# Patient Record
Sex: Female | Born: 1975 | Race: White | Hispanic: No | Marital: Married | Smoking: Never smoker
Health system: Southern US, Community
[De-identification: ages and names within clinical notes are randomized; demographics above are authoritative.]

## PROBLEM LIST (undated history)

## (undated) DIAGNOSIS — F329 Major depressive disorder, single episode, unspecified: Secondary | ICD-10-CM

## (undated) DIAGNOSIS — G4730 Sleep apnea, unspecified: Secondary | ICD-10-CM

## (undated) DIAGNOSIS — F419 Anxiety disorder, unspecified: Secondary | ICD-10-CM

## (undated) DIAGNOSIS — F32A Depression, unspecified: Secondary | ICD-10-CM

## (undated) DIAGNOSIS — R51 Headache: Secondary | ICD-10-CM

## (undated) DIAGNOSIS — D229 Melanocytic nevi, unspecified: Secondary | ICD-10-CM

## (undated) HISTORY — DX: Melanocytic nevi, unspecified: D22.9

## (undated) HISTORY — PX: NOVASURE ABLATION: SHX5394

## (undated) HISTORY — PX: CHOLECYSTECTOMY: SHX55

## (undated) HISTORY — PX: TUBAL LIGATION: SHX77

---

## 2007-05-09 HISTORY — PX: LAPAROSCOPIC GASTRIC BANDING: SHX1100

## 2007-07-05 ENCOUNTER — Ambulatory Visit (HOSPITAL_COMMUNITY): Admission: RE | Admit: 2007-07-05 | Discharge: 2007-07-05 | Payer: Self-pay | Admitting: Obstetrics

## 2007-07-05 ENCOUNTER — Encounter (INDEPENDENT_AMBULATORY_CARE_PROVIDER_SITE_OTHER): Payer: Self-pay | Admitting: Obstetrics

## 2007-07-06 ENCOUNTER — Inpatient Hospital Stay (HOSPITAL_COMMUNITY): Admission: AD | Admit: 2007-07-06 | Discharge: 2007-07-06 | Payer: Self-pay | Admitting: Obstetrics

## 2010-09-20 NOTE — Op Note (Signed)
NAMEJOYDAN, GRETZINGER              ACCOUNT NO.:  [id number]   MEDICAL RECORD NO.:  [id number]          PATIENT TYPE:  AMB   LOCATION:  SDC                           FACILITY:  [facility]   PHYSICIAN:  Lendon Colonel, MD   DATE OF BIRTH:  [birth year]   DATE OF PROCEDURE:  07/05/2007  DATE OF DISCHARGE:                               OPERATIVE REPORT   PREOPERATIVE DIAGNOSIS:  Menorrhagia.   POSTOPERATIVE DIAGNOSIS:  Menorrhagia.   OPERATION/PROCEDURE:  1. Dilatation and curettage.  2. Hysteroscopy.  3. NovaSure endometrial ablation.   SURGEON:  Lendon Colonel, M.D.   ASSISTANT:  None.   ANESTHESIA:  General.   FINDINGS:  Normal bilateral ostia were visualized.  No fibroids or  polyps.  Fluffy endometrium.   SPECIMENS:  EC specimen to pathology.   ESTIMATED BLOOD LOSS:  Minimal.   COMPLICATIONS:  None.   URINARY OUTPUT:  500 mL straight catheter.   INDICATIONS:  This is a 35 year old female with long history of  menorrhagia, normal preoperative ultrasound and preoperative endometrial  biopsy who after discussion of each the methods to control her  menorrhagia, elected for NovaSure endometrial ablation with preprocedure  hysteroscopy to insure no polyps or fibroids.   DESCRIPTION OF PROCEDURE:  After informed consent was obtained, the  patient was taken to the operating room where general anesthesia was  initiated without difficulty.  She was prepped and draped in the normal  sterile fashion.  She was in supine lithotomy position and straight cath  was performed for 500 mL of clear urine.  Bimanual examination was  performed assessing  the size and position of the uterus.  A sterile  speculum was inserted.  Single-tooth tenaculum was used to grasp the  anterior lip of the cervix.  The cervix was sounded to 7.5 cm and with  Shawnie Pons dilators and a sound and Hegar dilators were used to obtain the  cervical length of 3.5 cm.  In addition, the NovaSure sound device was  used to  reaffirm cavity length of 4 cm.  The Betocci  hysteroscope was  inserted into the uterine cavity.  No polyps or fibroids were noted.  Gross fluffy endometrium was noted and bilateral ostia were visualized.  Sharp curettage was done and specimens sent to pathology.  The NovaSure  device was then inserted into the uterus with a length setting of 4 and  a width setting of 3.5 cm and endometrial ablation device was employed  for a total of 1 minute and 57 seconds.  The device was then removed.  The tenaculum was removed and 10 mL of 1% lidocaine  was infused into the cervicovaginal junction.  The patient was awakened  from general anesthesia having tolerated the procedure well.  Sponge,  lap and needle counts were correct x3.  The patient was taken to the  recovery room in stable condition.      Lendon Colonel, MD  Electronically Signed     KAF/MEDQ  D:  07/05/2007  T:  07/06/2007  Job:  [id number]

## 2011-01-30 LAB — CBC
MCHC: 35.2
MCV: 88.8
Platelets: 358

## 2011-01-30 LAB — URINALYSIS, ROUTINE W REFLEX MICROSCOPIC
Bilirubin Urine: NEGATIVE
Nitrite: NEGATIVE
Specific Gravity, Urine: 1.02
pH: 5.5

## 2011-01-30 LAB — ABO/RH: ABO/RH(D): A POS

## 2011-01-30 LAB — PREGNANCY, URINE: Preg Test, Ur: NEGATIVE

## 2011-04-27 DIAGNOSIS — D229 Melanocytic nevi, unspecified: Secondary | ICD-10-CM

## 2011-04-27 HISTORY — DX: Melanocytic nevi, unspecified: D22.9

## 2011-05-25 ENCOUNTER — Other Ambulatory Visit: Payer: Self-pay | Admitting: Physician Assistant

## 2011-06-29 ENCOUNTER — Other Ambulatory Visit: Payer: Self-pay | Admitting: Physician Assistant

## 2011-11-29 ENCOUNTER — Other Ambulatory Visit: Payer: Self-pay | Admitting: Physician Assistant

## 2012-01-03 ENCOUNTER — Encounter (HOSPITAL_COMMUNITY): Payer: Self-pay | Admitting: Emergency Medicine

## 2012-01-03 ENCOUNTER — Emergency Department (HOSPITAL_COMMUNITY)
Admission: EM | Admit: 2012-01-03 | Discharge: 2012-01-03 | Disposition: A | Discharge status: Home / Self Care | Payer: BC Managed Care – PPO | Attending: Emergency Medicine | Admitting: Emergency Medicine

## 2012-01-03 ENCOUNTER — Emergency Department (HOSPITAL_COMMUNITY): Payer: BC Managed Care – PPO

## 2012-01-03 DIAGNOSIS — R569 Unspecified convulsions: Secondary | ICD-10-CM | POA: Insufficient documentation

## 2012-01-03 DIAGNOSIS — Z79899 Other long term (current) drug therapy: Secondary | ICD-10-CM | POA: Insufficient documentation

## 2012-01-03 DIAGNOSIS — R4182 Altered mental status, unspecified: Secondary | ICD-10-CM | POA: Insufficient documentation

## 2012-01-03 DIAGNOSIS — R55 Syncope and collapse: Secondary | ICD-10-CM | POA: Insufficient documentation

## 2012-01-03 LAB — CBC WITH DIFFERENTIAL/PLATELET
Eosinophils Absolute: 0.3 10*3/uL (ref 0.0–0.7)
Eosinophils Relative: 3 % (ref 0–5)
HCT: 35.8 % — ABNORMAL LOW (ref 36.0–46.0)
Hemoglobin: 12.6 g/dL (ref 12.0–15.0)
Lymphs Abs: 2.1 10*3/uL (ref 0.7–4.0)
MCH: 31.6 pg (ref 26.0–34.0)
MCV: 89.7 fL (ref 78.0–100.0)
Monocytes Absolute: 0.6 10*3/uL (ref 0.1–1.0)
Monocytes Relative: 6 % (ref 3–12)
RBC: 3.99 MIL/uL (ref 3.87–5.11)

## 2012-01-03 LAB — BASIC METABOLIC PANEL
BUN: 12 mg/dL (ref 6–23)
Calcium: 8.7 mg/dL (ref 8.4–10.5)
GFR calc non Af Amer: 85 mL/min — ABNORMAL LOW (ref >90)
Glucose, Bld: 80 mg/dL (ref 70–99)
Potassium: 4.6 mEq/L (ref 3.5–5.1)

## 2012-01-03 MED ORDER — MORPHINE SULFATE 4 MG/ML IJ SOLN
4.0000 mg | Freq: Once | INTRAMUSCULAR | Status: AC
  Administered 2012-01-03: 4 mg via INTRAVENOUS
  Filled 2012-01-03: qty 1

## 2012-01-03 MED ORDER — ONDANSETRON HCL 4 MG/2ML IJ SOLN
4.0000 mg | Freq: Once | INTRAMUSCULAR | Status: AC
  Administered 2012-01-03: 4 mg via INTRAVENOUS
  Filled 2012-01-03: qty 2

## 2012-01-03 MED ORDER — HYDROCODONE-ACETAMINOPHEN 5-500 MG PO TABS: 1.0000 | ORAL_TABLET | Freq: Four times a day (QID) | ORAL | Status: AC | PRN

## 2012-01-03 MED ORDER — SODIUM CHLORIDE 0.9 % IV BOLUS (SEPSIS)
1000.0000 mL | Freq: Once | INTRAVENOUS | Status: AC
  Administered 2012-01-03: 1000 mL via INTRAVENOUS

## 2012-01-03 MED ORDER — ONDANSETRON HCL 4 MG PO TABS: 4.0000 mg | ORAL_TABLET | Freq: Four times a day (QID) | ORAL | Status: AC

## 2012-01-03 NOTE — ED Provider Notes (Signed)
History     CSN: [id number]  Arrival date & time 01/03/12  1203   First MD Initiated Contact with Patient 01/03/12 1214      Chief Complaint  Patient presents with  . Seizures    (Consider location/radiation/quality/duration/timing/severity/associated sxs/prior treatment) HPI  To the emergency department from her gynecologist office of a syncopal episode. The patient was having a endometrial biopsy procedure done when she said she felt extreme pain. The gynecologist then gave her a shot of lidocaine into her endometrium and the patient says she immediately felt a warm sensation going up her head and she fainted. The mom who is also the room states that right after her left arm began to convulse. This episode lasted approximately 2 minutes. That was convulsing was the left arm. There is no associated diaphoresis, vomiting, loss of incontinence, or post ictal state. Upon arrival to the ER the patient began to vomit and had 3 vomiting episodes. Alert and oriented she is in no acute distress.  History reviewed. No pertinent past medical history.  Past Surgical History  Procedure Date  . Cholecystectomy   . Tubal ligation     No family history on file.  History  Substance Use Topics  . Smoking status: Never Smoker   . Smokeless tobacco: Not on file  . Alcohol Use: No    OB History    Grav Para Term Preterm Abortions TAB SAB Ect Mult Living                  Review of Systems  All other systems reviewed and are negative.    Allergies  Penicillins  Home Medications   Current Outpatient Rx  Name Route Sig Dispense Refill  . CLONAZEPAM 1 MG PO TABS Oral Take 1 mg by mouth 2 (two) times daily as needed.    . CYCLOBENZAPRINE HCL 10 MG PO TABS Oral Take 10 mg by mouth 3 (three) times daily as needed. For muscle spasms.    Marland Kitchen GABAPENTIN 300 MG PO CAPS Oral Take 300 mg by mouth 3 (three) times daily.    Marland Kitchen OXCARBAZEPINE 300 MG PO TABS Oral Take 600 mg by mouth 2 (two) times  daily.    . TRAZODONE HCL 100 MG PO TABS Oral Take 150 mg by mouth at bedtime.    Marland Kitchen HYDROCODONE-ACETAMINOPHEN 5-500 MG PO TABS Oral Take 1 tablet by mouth every 6 (six) hours as needed for pain. 15 tablet 0  . ONDANSETRON HCL 4 MG PO TABS Oral Take 1 tablet (4 mg total) by mouth every 6 (six) hours. 12 tablet 0    BP 118/83  Pulse 64  Temp 98.5 F (36.9 C) (Oral)  Resp 16  SpO2 99%  Physical Exam  Nursing note and vitals reviewed. Constitutional: She appears well-developed and well-nourished. No distress.  HENT:  Head: Normocephalic and atraumatic.  Eyes: Pupils are equal, round, and reactive to light.  Neck: Normal range of motion. Neck supple.  Cardiovascular: Normal rate and regular rhythm.   Pulmonary/Chest: Effort normal.  Abdominal: Soft.  Neurological: She is alert. She has normal strength. No cranial nerve deficit (3-12 intact) or sensory deficit. GCS eye subscore is 4. GCS verbal subscore is 5. GCS motor subscore is 6.  Skin: Skin is warm and dry.    ED Course  Procedures (including critical care time)  Labs Reviewed  CBC WITH DIFFERENTIAL - Abnormal; Notable for the following:    HCT 35.8 (*)     All other  components within normal limits  BASIC METABOLIC PANEL - Abnormal; Notable for the following:    GFR calc non Af Amer 85 (*)     All other components within normal limits   Ct Head Wo Contrast  01/03/2012  *RADIOLOGY REPORT*  Clinical Data: Altered mental status, seizure  CT HEAD WITHOUT CONTRAST  Technique:  Contiguous axial images were obtained from the base of the skull through the vertex without contrast.  Comparison: None.  Findings: No acute intracranial hemorrhage.  No focal mass lesion. No CT evidence of acute infarction.   No midline shift or mass effect.  No hydrocephalus.  Basilar cisterns are patent. Paranasal sinuses and mastoid air cells are clear.  Orbits are normal.  IMPRESSION: Normal CT  head   Original Report Authenticated By: Genevive Bi,  M.D.      1. Convulsive syncope       MDM   Patients labs are normal. This does not sound consistent with a seizure. I consulted neurology to advise at the patient white count remained stable, the potassium did not drop. He believes this is syncope with convulsions due to the access adrenaline in her system, I agree with his opinion.   I have discussed and reassured patient and family. i do not believe that she needs to follow-up with neuro or be started on any seizure medications.  Pt has been advised of the symptoms that warrant their return to the ED. Patient has voiced understanding and has agreed to follow-up with the PCP or specialist.         Dorthula Matas, PA 01/03/12 1549

## 2012-01-03 NOTE — ED Notes (Signed)
Patient transported to CT 

## 2012-01-03 NOTE — ED Notes (Signed)
Pt undressed, in gown, on continuous pulse oximetry and blood pressure cuff 

## 2012-01-03 NOTE — ED Notes (Signed)
To ED from [facility] GYN via EMS, pt received lidocaine/epi shot, passed out and had a witnessed 3 min seizure by MD, was post-ictal following, A/O on EMS arrival, c/o HA, weakness, no incontence or oral trauma, CBG 276, had 700 ml D5 prior to EMS arrival

## 2012-01-03 NOTE — ED Notes (Signed)
Family at bedside. 

## 2012-01-04 NOTE — ED Provider Notes (Signed)
Medical screening examination/treatment/procedure(s) were performed by non-physician practitioner and as supervising physician I was immediately available for consultation/collaboration.   Loren Racer, MD 01/04/12 [phone]

## 2012-01-24 ENCOUNTER — Encounter (HOSPITAL_COMMUNITY): Payer: Self-pay | Admitting: Pharmacist

## 2012-01-29 ENCOUNTER — Encounter (HOSPITAL_COMMUNITY): Payer: Self-pay

## 2012-01-29 ENCOUNTER — Other Ambulatory Visit: Payer: Self-pay | Admitting: Obstetrics

## 2012-01-29 ENCOUNTER — Encounter (HOSPITAL_COMMUNITY)
Admission: RE | Admit: 2012-01-29 | Discharge: 2012-01-29 | Disposition: A | Discharge status: Home / Self Care | Payer: BC Managed Care – PPO | Source: Ambulatory Visit | Attending: Obstetrics | Admitting: Obstetrics

## 2012-01-29 HISTORY — DX: Anxiety disorder, unspecified: F41.9

## 2012-01-29 HISTORY — DX: Depression, unspecified: F32.A

## 2012-01-29 HISTORY — DX: Headache: R51

## 2012-01-29 HISTORY — DX: Sleep apnea, unspecified: G47.30

## 2012-01-29 HISTORY — DX: Major depressive disorder, single episode, unspecified: F32.9

## 2012-01-29 LAB — CBC
HCT: 40.5 % (ref 36.0–46.0)
MCV: 88.8 fL (ref 78.0–100.0)
RDW: 12.3 % (ref 11.5–15.5)
WBC: 11.9 10*3/uL — ABNORMAL HIGH (ref 4.0–10.5)

## 2012-01-29 NOTE — Patient Instructions (Signed)
Your procedure is scheduled on:02/07/12  Enter through the Main Entrance at :6am Pick up desk phone and dial [phone] and inform us of your arrival.  Please call [phone] if you have any problems the morning of surgery.  Remember: Do not eat after midnight:Tuesday Do not drink after:midnight Tuesday  Take these meds the morning of surgery with a sip of water:Inderal   DO NOT wear jewelry, eye make-up, lipstick,body lotion, or dark fingernail polish. Do not shave for 48 hours prior to surgery.  If you are to be admitted after surgery, leave suitcase in car until your room has been assigned. Patients discharged on the day of surgery will not be allowed to drive home.   Remember to use your Hibiclens as instructed.

## 2012-02-01 ENCOUNTER — Other Ambulatory Visit: Payer: Self-pay | Admitting: Obstetrics

## 2012-02-06 ENCOUNTER — Encounter (HOSPITAL_COMMUNITY): Payer: Self-pay | Admitting: Anesthesiology

## 2012-02-06 MED ORDER — GENTAMICIN SULFATE 40 MG/ML IJ SOLN
INTRAVENOUS | Status: AC
  Administered 2012-02-07: 100 mL via INTRAVENOUS
  Filled 2012-02-06: qty 8.88

## 2012-02-06 NOTE — Anesthesia Preprocedure Evaluation (Addendum)
Anesthesia Evaluation  Patient identified by MRN, date of birth, ID band Patient awake    Reviewed: Allergy & Precautions, H&P , NPO status , Patient's Chart, lab work & pertinent test results  Airway Mallampati: III TM Distance: >3 FB Neck ROM: Full    Dental No notable dental hx. (+) Teeth Intact   Pulmonary sleep apnea ,  breath sounds clear to auscultation  Pulmonary exam normal       Cardiovascular negative cardio ROS  Rhythm:Regular Rate:Normal     Neuro/Psych  Headaches, PSYCHIATRIC DISORDERS Anxiety Depression    GI/Hepatic negative GI ROS, Neg liver ROS,   Endo/Other  negative endocrine ROS  Renal/GU negative Renal ROS  negative genitourinary   Musculoskeletal negative musculoskeletal ROS (+)   Abdominal   Peds  Hematology negative hematology ROS (+)   Anesthesia Other Findings   Reproductive/Obstetrics DUB Pelvic Pain Failed Novasure Ablation                         Anesthesia Physical Anesthesia Plan  ASA: III  Anesthesia Plan: General   Post-op Pain Management:    Induction: Intravenous  Airway Management Planned: Oral ETT  Additional Equipment:   Intra-op Plan:   Post-operative Plan: Extubation in OR  Informed Consent: I have reviewed the patients History and Physical, chart, labs and discussed the procedure including the risks, benefits and alternatives for the proposed anesthesia with the patient or authorized representative who has indicated his/her understanding and acceptance.   Dental advisory given  Plan Discussed with: CRNA, Anesthesiologist and Surgeon  Anesthesia Plan Comments:         Anesthesia Quick Evaluation

## 2012-02-07 ENCOUNTER — Encounter (HOSPITAL_COMMUNITY): Payer: Self-pay | Admitting: Anesthesiology

## 2012-02-07 ENCOUNTER — Ambulatory Visit (HOSPITAL_COMMUNITY)
Admission: RE | Admit: 2012-02-07 | Discharge: 2012-02-08 | Disposition: A | Discharge status: Home / Self Care | Payer: BC Managed Care – PPO | Source: Ambulatory Visit | Attending: Obstetrics | Admitting: Obstetrics

## 2012-02-07 ENCOUNTER — Encounter (HOSPITAL_COMMUNITY): Payer: Self-pay | Admitting: *Deleted

## 2012-02-07 ENCOUNTER — Ambulatory Visit (HOSPITAL_COMMUNITY): Payer: BC Managed Care – PPO | Admitting: Anesthesiology

## 2012-02-07 ENCOUNTER — Encounter (HOSPITAL_COMMUNITY)
Admission: RE | Disposition: A | Discharge status: Home / Self Care | Payer: Self-pay | Source: Ambulatory Visit | Attending: Obstetrics

## 2012-02-07 DIAGNOSIS — Z01818 Encounter for other preprocedural examination: Secondary | ICD-10-CM | POA: Insufficient documentation

## 2012-02-07 DIAGNOSIS — N938 Other specified abnormal uterine and vaginal bleeding: Principal | ICD-10-CM | POA: Insufficient documentation

## 2012-02-07 DIAGNOSIS — N949 Unspecified condition associated with female genital organs and menstrual cycle: Secondary | ICD-10-CM | POA: Insufficient documentation

## 2012-02-07 DIAGNOSIS — Z01812 Encounter for preprocedural laboratory examination: Secondary | ICD-10-CM | POA: Insufficient documentation

## 2012-02-07 HISTORY — PX: LAPAROSCOPIC HYSTERECTOMY: SHX1926

## 2012-02-07 LAB — BASIC METABOLIC PANEL
BUN: 10 mg/dL (ref 6–23)
Calcium: 9.2 mg/dL (ref 8.4–10.5)
Creatinine, Ser: 0.8 mg/dL (ref 0.50–1.10)
GFR calc non Af Amer: >90 mL/min (ref >90)
Glucose, Bld: 95 mg/dL (ref 70–99)
Sodium: 135 mEq/L (ref 135–145)

## 2012-02-07 SURGERY — HYSTERECTOMY, TOTAL, LAPAROSCOPIC
Anesthesia: General | Wound class: Clean Contaminated

## 2012-02-07 MED ORDER — PHENYLEPHRINE 40 MCG/ML (10ML) SYRINGE FOR IV PUSH (FOR BLOOD PRESSURE SUPPORT)
PREFILLED_SYRINGE | INTRAVENOUS | Status: AC
  Filled 2012-02-07: qty 10

## 2012-02-07 MED ORDER — MIDAZOLAM HCL 2 MG/2ML IJ SOLN
INTRAMUSCULAR | Status: AC
  Filled 2012-02-07: qty 2

## 2012-02-07 MED ORDER — MENTHOL 3 MG MT LOZG: 1.0000 | LOZENGE | OROMUCOSAL | Status: DC | PRN

## 2012-02-07 MED ORDER — HYDROMORPHONE HCL PF 1 MG/ML IJ SOLN
INTRAMUSCULAR | Status: DC | PRN
  Administered 2012-02-07: 1 mg via INTRAVENOUS

## 2012-02-07 MED ORDER — ONDANSETRON HCL 4 MG/2ML IJ SOLN: 4.0000 mg | Freq: Four times a day (QID) | INTRAMUSCULAR | Status: DC | PRN

## 2012-02-07 MED ORDER — ONDANSETRON HCL 4 MG PO TABS: 4.0000 mg | ORAL_TABLET | Freq: Four times a day (QID) | ORAL | Status: DC | PRN

## 2012-02-07 MED ORDER — HYDROMORPHONE HCL PF 1 MG/ML IJ SOLN
INTRAMUSCULAR | Status: AC
  Filled 2012-02-07: qty 1

## 2012-02-07 MED ORDER — ONDANSETRON HCL 4 MG/2ML IJ SOLN
INTRAMUSCULAR | Status: AC
  Filled 2012-02-07: qty 2

## 2012-02-07 MED ORDER — KETOROLAC TROMETHAMINE 30 MG/ML IJ SOLN
30.0000 mg | Freq: Four times a day (QID) | INTRAMUSCULAR | Status: DC
  Administered 2012-02-07 – 2012-02-08 (×3): 30 mg via INTRAVENOUS
  Filled 2012-02-07 (×2): qty 1

## 2012-02-07 MED ORDER — MEPERIDINE HCL 25 MG/ML IJ SOLN: 6.2500 mg | INTRAMUSCULAR | Status: DC | PRN

## 2012-02-07 MED ORDER — BUPIVACAINE HCL (PF) 0.25 % IJ SOLN
INTRAMUSCULAR | Status: DC | PRN
  Administered 2012-02-07: 18 mL

## 2012-02-07 MED ORDER — PROPOFOL 10 MG/ML IV EMUL
INTRAVENOUS | Status: AC
  Filled 2012-02-07: qty 20

## 2012-02-07 MED ORDER — KETOROLAC TROMETHAMINE 30 MG/ML IJ SOLN
INTRAMUSCULAR | Status: DC | PRN
  Administered 2012-02-07: 6 mg via INTRAVENOUS

## 2012-02-07 MED ORDER — PROPRANOLOL HCL 80 MG PO TABS
160.0000 mg | ORAL_TABLET | Freq: Every day | ORAL | Status: DC
  Filled 2012-02-07 (×3): qty 2

## 2012-02-07 MED ORDER — KETOROLAC TROMETHAMINE 60 MG/2ML IM SOLN
INTRAMUSCULAR | Status: AC
  Filled 2012-02-07: qty 2

## 2012-02-07 MED ORDER — PROPRANOLOL HCL 80 MG PO TABS
80.0000 mg | ORAL_TABLET | Freq: Every day | ORAL | Status: DC
  Administered 2012-02-07: 80 mg via ORAL
  Filled 2012-02-07 (×2): qty 1

## 2012-02-07 MED ORDER — GLYCOPYRROLATE 0.2 MG/ML IJ SOLN
INTRAMUSCULAR | Status: DC | PRN
  Administered 2012-02-07: 0.4 mg via INTRAVENOUS

## 2012-02-07 MED ORDER — ROCURONIUM BROMIDE 50 MG/5ML IV SOLN
INTRAVENOUS | Status: AC
  Filled 2012-02-07: qty 1

## 2012-02-07 MED ORDER — OXYCODONE-ACETAMINOPHEN 5-325 MG PO TABS
1.0000 | ORAL_TABLET | ORAL | Status: DC | PRN
  Administered 2012-02-07 – 2012-02-08 (×4): 2 via ORAL
  Filled 2012-02-07 (×4): qty 2

## 2012-02-07 MED ORDER — ROCURONIUM BROMIDE 100 MG/10ML IV SOLN
INTRAVENOUS | Status: DC | PRN
  Administered 2012-02-07 (×4): 10 mg via INTRAVENOUS
  Administered 2012-02-07: 45 mg via INTRAVENOUS
  Administered 2012-02-07: 5 mg via INTRAVENOUS

## 2012-02-07 MED ORDER — NEOSTIGMINE METHYLSULFATE 1 MG/ML IJ SOLN
INTRAMUSCULAR | Status: AC
  Filled 2012-02-07: qty 10

## 2012-02-07 MED ORDER — LACTATED RINGERS IV SOLN
INTRAVENOUS | Status: DC
  Administered 2012-02-07 (×5): via INTRAVENOUS

## 2012-02-07 MED ORDER — PHENYLEPHRINE HCL 10 MG/ML IJ SOLN: INTRAMUSCULAR | Status: DC | PRN

## 2012-02-07 MED ORDER — PROPOFOL 10 MG/ML IV EMUL
INTRAVENOUS | Status: DC | PRN
  Administered 2012-02-07: 200 mg via INTRAVENOUS

## 2012-02-07 MED ORDER — DIPHENHYDRAMINE HCL 50 MG/ML IJ SOLN: 25.0000 mg | INTRAMUSCULAR | Status: DC | PRN

## 2012-02-07 MED ORDER — FENTANYL CITRATE 0.05 MG/ML IJ SOLN
INTRAMUSCULAR | Status: AC
  Administered 2012-02-07: 50 ug via INTRAVENOUS
  Filled 2012-02-07: qty 2

## 2012-02-07 MED ORDER — DEXAMETHASONE SODIUM PHOSPHATE 4 MG/ML IJ SOLN
INTRAMUSCULAR | Status: DC | PRN
  Administered 2012-02-07: 10 mg via INTRAVENOUS

## 2012-02-07 MED ORDER — ONDANSETRON HCL 4 MG/2ML IJ SOLN
INTRAMUSCULAR | Status: DC | PRN
  Administered 2012-02-07: 4 mg via INTRAVENOUS

## 2012-02-07 MED ORDER — DIPHENHYDRAMINE HCL 25 MG PO CAPS
25.0000 mg | ORAL_CAPSULE | ORAL | Status: DC | PRN
  Filled 2012-02-07: qty 1

## 2012-02-07 MED ORDER — GLYCOPYRROLATE 0.2 MG/ML IJ SOLN
INTRAMUSCULAR | Status: AC
  Filled 2012-02-07: qty 2

## 2012-02-07 MED ORDER — PROMETHAZINE HCL 25 MG PO TABS
25.0000 mg | ORAL_TABLET | Freq: Four times a day (QID) | ORAL | Status: DC | PRN
  Administered 2012-02-08: 25 mg via ORAL
  Filled 2012-02-07: qty 1

## 2012-02-07 MED ORDER — MIDAZOLAM HCL 5 MG/5ML IJ SOLN
INTRAMUSCULAR | Status: DC | PRN
  Administered 2012-02-07: 2 mg via INTRAVENOUS

## 2012-02-07 MED ORDER — LIDOCAINE HCL (CARDIAC) 20 MG/ML IV SOLN
INTRAVENOUS | Status: AC
  Filled 2012-02-07: qty 5

## 2012-02-07 MED ORDER — DEXAMETHASONE SODIUM PHOSPHATE 10 MG/ML IJ SOLN
INTRAMUSCULAR | Status: AC
  Filled 2012-02-07: qty 1

## 2012-02-07 MED ORDER — FENTANYL CITRATE 0.05 MG/ML IJ SOLN
25.0000 ug | INTRAMUSCULAR | Status: DC | PRN
  Administered 2012-02-07 (×3): 50 ug via INTRAVENOUS

## 2012-02-07 MED ORDER — ARTIFICIAL TEARS OP OINT
TOPICAL_OINTMENT | OPHTHALMIC | Status: AC
  Filled 2012-02-07: qty 3.5

## 2012-02-07 MED ORDER — IBUPROFEN 600 MG PO TABS: 600.0000 mg | ORAL_TABLET | Freq: Four times a day (QID) | ORAL | Status: DC | PRN

## 2012-02-07 MED ORDER — PHENYLEPHRINE HCL 10 MG/ML IJ SOLN
INTRAMUSCULAR | Status: DC | PRN
  Administered 2012-02-07: 40 ug via INTRAVENOUS
  Administered 2012-02-07: 80 ug via INTRAVENOUS
  Administered 2012-02-07 (×2): 40 ug via INTRAVENOUS
  Administered 2012-02-07: 80 ug via INTRAVENOUS

## 2012-02-07 MED ORDER — PROPRANOLOL HCL 80 MG PO TABS: 80.0000 mg | ORAL_TABLET | Freq: Two times a day (BID) | ORAL | Status: DC

## 2012-02-07 MED ORDER — RINGERS IRRIGATION IR SOLN
Status: DC | PRN
  Administered 2012-02-07: 1

## 2012-02-07 MED ORDER — DIPHENHYDRAMINE HCL 50 MG/ML IJ SOLN: 12.5000 mg | INTRAMUSCULAR | Status: DC | PRN

## 2012-02-07 MED ORDER — NEOSTIGMINE METHYLSULFATE 1 MG/ML IJ SOLN
INTRAMUSCULAR | Status: DC | PRN
  Administered 2012-02-07: 2 mg via INTRAVENOUS

## 2012-02-07 MED ORDER — INFLUENZA VIRUS VACC SPLIT PF IM SUSP: 0.5000 mL | INTRAMUSCULAR | Status: DC

## 2012-02-07 MED ORDER — HYDROMORPHONE HCL PF 1 MG/ML IJ SOLN
1.0000 mg | INTRAMUSCULAR | Status: DC | PRN
  Administered 2012-02-07: 1 mg via INTRAVENOUS
  Filled 2012-02-07: qty 1

## 2012-02-07 MED ORDER — PANTOPRAZOLE SODIUM 40 MG PO TBEC
40.0000 mg | DELAYED_RELEASE_TABLET | Freq: Every day | ORAL | Status: DC
  Administered 2012-02-07: 40 mg via ORAL
  Filled 2012-02-07 (×2): qty 1

## 2012-02-07 MED ORDER — CLONAZEPAM 0.5 MG PO TABS
1.5000 mg | ORAL_TABLET | Freq: Every day | ORAL | Status: DC
  Administered 2012-02-07: 1.5 mg via ORAL
  Filled 2012-02-07: qty 3

## 2012-02-07 MED ORDER — FENTANYL CITRATE 0.05 MG/ML IJ SOLN
INTRAMUSCULAR | Status: AC
  Filled 2012-02-07: qty 5

## 2012-02-07 MED ORDER — KETOROLAC TROMETHAMINE 30 MG/ML IJ SOLN
30.0000 mg | Freq: Four times a day (QID) | INTRAMUSCULAR | Status: DC
  Filled 2012-02-07: qty 1

## 2012-02-07 MED ORDER — METOCLOPRAMIDE HCL 5 MG/ML IJ SOLN: 10.0000 mg | Freq: Once | INTRAMUSCULAR | Status: DC | PRN

## 2012-02-07 MED ORDER — SCOPOLAMINE 1 MG/3DAYS TD PT72
1.0000 | MEDICATED_PATCH | TRANSDERMAL | Status: DC
  Administered 2012-02-07: 1.5 mg via TRANSDERMAL

## 2012-02-07 MED ORDER — FENTANYL CITRATE 0.05 MG/ML IJ SOLN
INTRAMUSCULAR | Status: DC | PRN
  Administered 2012-02-07 (×5): 50 ug via INTRAVENOUS

## 2012-02-07 MED ORDER — BUPIVACAINE HCL (PF) 0.25 % IJ SOLN
INTRAMUSCULAR | Status: AC
  Filled 2012-02-07: qty 30

## 2012-02-07 MED ORDER — SCOPOLAMINE 1 MG/3DAYS TD PT72
MEDICATED_PATCH | TRANSDERMAL | Status: AC
  Filled 2012-02-07: qty 1

## 2012-02-07 MED ORDER — SODIUM CHLORIDE 0.9 % IV SOLN: INTRAVENOUS | Status: DC

## 2012-02-07 MED ORDER — TRAZODONE HCL 150 MG PO TABS
150.0000 mg | ORAL_TABLET | Freq: Every evening | ORAL | Status: DC | PRN
  Filled 2012-02-07: qty 1

## 2012-02-07 SURGICAL SUPPLY — 111 items
BAG URINE DRAINAGE (UROLOGICAL SUPPLIES) ×2 IMPLANT
BARRIER ADHS 3X4 INTERCEED (GAUZE/BANDAGES/DRESSINGS) ×2 IMPLANT
BLADE LAPAROSCOPIC MORCELL KIT (BLADE) IMPLANT
CABLE HIGH FREQUENCY MONO STRZ (ELECTRODE) ×2 IMPLANT
CANISTER SUCTION 2500CC (MISCELLANEOUS) ×2 IMPLANT
CATH FOLEY 3WAY  5CC 16FR (CATHETERS) ×1
CATH FOLEY 3WAY 5CC 16FR (CATHETERS) ×1 IMPLANT
CHLORAPREP W/TINT 26ML (MISCELLANEOUS) ×4 IMPLANT
CLIP SUT LAPRA TY ABSORB (SUTURE) ×2 IMPLANT
CLOTH BEACON ORANGE TIMEOUT ST (SAFETY) ×2 IMPLANT
CONT PATH 16OZ SNAP LID 3702 (MISCELLANEOUS) ×2 IMPLANT
CONTAINER PREFILL 10% NBF 60ML (FORM) IMPLANT
COVER MAYO STAND STRL (DRAPES) ×2 IMPLANT
COVER TABLE BACK 60X90 (DRAPES) ×4 IMPLANT
COVER TIP SHEARS 8 DVNC (MISCELLANEOUS) ×1 IMPLANT
COVER TIP SHEARS 8MM DA VINCI (MISCELLANEOUS) ×1
DECANTER SPIKE VIAL GLASS SM (MISCELLANEOUS) ×2 IMPLANT
DERMABOND ADVANCED (GAUZE/BANDAGES/DRESSINGS) ×1
DERMABOND ADVANCED .7 DNX12 (GAUZE/BANDAGES/DRESSINGS) ×1 IMPLANT
DRAPE HUG U DISPOSABLE (DRAPE) ×2 IMPLANT
DRAPE LG THREE QUARTER DISP (DRAPES) ×4 IMPLANT
DRAPE MONITOR DA VINCI (DRAPE) IMPLANT
DRAPE WARM FLUID 44X44 (DRAPE) ×2 IMPLANT
ELECT BLADE 6 FLAT ULTRCLN (ELECTRODE) IMPLANT
ELECT NEEDLE TIP 2.8 STRL (NEEDLE) IMPLANT
ELECT REM PT RETURN 9FT ADLT (ELECTROSURGICAL) ×2
ELECTRODE REM PT RTRN 9FT ADLT (ELECTROSURGICAL) ×1 IMPLANT
EVACUATOR SMOKE 8.L (FILTER) ×2 IMPLANT
FORCEPS CUTTING 33CM 5MM (CUTTING FORCEPS) ×2 IMPLANT
GAUZE PACKING IODOFORM 2 (PACKING) IMPLANT
GAUZE SPONGE 4X4 16PLY XRAY LF (GAUZE/BANDAGES/DRESSINGS) ×2 IMPLANT
GAUZE VASELINE 3X9 (GAUZE/BANDAGES/DRESSINGS) IMPLANT
GLOVE BIO SURGEON STRL SZ 6.5 (GLOVE) ×10 IMPLANT
GLOVE BIOGEL PI IND STRL 6.5 (GLOVE) ×1 IMPLANT
GLOVE BIOGEL PI IND STRL 7.0 (GLOVE) ×2 IMPLANT
GLOVE BIOGEL PI INDICATOR 6.5 (GLOVE) ×1
GLOVE BIOGEL PI INDICATOR 7.0 (GLOVE) ×2
GOWN PREVENTION PLUS LG XLONG (DISPOSABLE) ×6 IMPLANT
GOWN STRL REIN XL XLG (GOWN DISPOSABLE) ×12 IMPLANT
IV STOPCOCK 4 WAY 40  W/Y SET (IV SOLUTION) ×1
IV STOPCOCK 4 WAY 40 W/Y SET (IV SOLUTION) ×1 IMPLANT
KIT ACCESSORY DA VINCI DISP (KITS) ×1
KIT ACCESSORY DVNC DISP (KITS) ×1 IMPLANT
KIT DISP ACCESSORY 4 ARM (KITS) IMPLANT
NEEDLE HYPO 22GX1.5 SAFETY (NEEDLE) IMPLANT
NEEDLE HYPO 25X1 1.5 SAFETY (NEEDLE) ×2 IMPLANT
NEEDLE INSUFFLATION 14GA 120MM (NEEDLE) IMPLANT
NS IRRIG 1000ML POUR BTL (IV SOLUTION) ×2 IMPLANT
OCCLUDER COLPOPNEUMO (BALLOONS) ×2 IMPLANT
PACK ABDOMINAL GYN (CUSTOM PROCEDURE TRAY) ×2 IMPLANT
PACK LAVH (CUSTOM PROCEDURE TRAY) ×2 IMPLANT
PAD ABD 7.5X8 STRL (GAUZE/BANDAGES/DRESSINGS) IMPLANT
PAD OB MATERNITY 4.3X12.25 (PERSONAL CARE ITEMS) ×2 IMPLANT
PAD PREP 24X48 CUFFED NSTRL (MISCELLANEOUS) ×4 IMPLANT
PLUG CATH AND CAP STER (CATHETERS) ×2 IMPLANT
PROTECTOR NERVE ULNAR (MISCELLANEOUS) ×4 IMPLANT
SCALPEL HARMONIC ACE (MISCELLANEOUS) ×2 IMPLANT
SCISSORS LAP 5X35 DISP (ENDOMECHANICALS) IMPLANT
SET CYSTO W/LG BORE CLAMP LF (SET/KITS/TRAYS/PACK) IMPLANT
SET IRRIG TUBING LAPAROSCOPIC (IRRIGATION / IRRIGATOR) ×4 IMPLANT
SOL LACTATED RINGERS 3000ML (IV SOLUTION) ×2 IMPLANT
SOLUTION ELECTROLUBE (MISCELLANEOUS) ×2 IMPLANT
SPATULA 33CM PLASMA (CUTTING FORCEPS) ×2 IMPLANT
SPONGE GAUZE 4X4 12PLY (GAUZE/BANDAGES/DRESSINGS) IMPLANT
SPONGE LAP 18X18 X RAY DECT (DISPOSABLE) IMPLANT
STAPLER VISISTAT 35W (STAPLE) ×2 IMPLANT
SUT MNCRL AB 4-0 PS2 18 (SUTURE) IMPLANT
SUT PLAIN 3 0 CT 1 27 (SUTURE) IMPLANT
SUT VIC AB 0 CT1 18XCR BRD8 (SUTURE) ×2 IMPLANT
SUT VIC AB 0 CT1 27 (SUTURE) ×2
SUT VIC AB 0 CT1 27XBRD ANBCTR (SUTURE) ×2 IMPLANT
SUT VIC AB 0 CT1 27XBRD ANTBC (SUTURE) IMPLANT
SUT VIC AB 0 CT1 36 (SUTURE) IMPLANT
SUT VIC AB 0 CT1 8-18 (SUTURE) ×2
SUT VIC AB 2-0 CT1 27 (SUTURE)
SUT VIC AB 2-0 CT1 TAPERPNT 27 (SUTURE) IMPLANT
SUT VIC AB 2-0 SH 27 (SUTURE)
SUT VIC AB 2-0 SH 27XBRD (SUTURE) IMPLANT
SUT VIC AB 3-0 SH 27 (SUTURE)
SUT VIC AB 3-0 SH 27X BRD (SUTURE) IMPLANT
SUT VIC AB 4-0 PS2 27 (SUTURE) ×4 IMPLANT
SUT VICRYL #0 CTB 1 (SUTURE) ×6 IMPLANT
SUT VICRYL 0 27 CT2 27 ABS (SUTURE) IMPLANT
SUT VICRYL 0 TIES 12 18 (SUTURE) ×2 IMPLANT
SUT VICRYL 0 UR6 27IN ABS (SUTURE) ×4 IMPLANT
SUT VICRYL 1 TIES 12X18 (SUTURE) ×2 IMPLANT
SUT VICRYL 3 0 BR 18  UND (SUTURE)
SUT VICRYL 3 0 BR 18 UND (SUTURE) IMPLANT
SUT VICRYL 4-0 PS2 18IN ABS (SUTURE) IMPLANT
SUT VLOC 180 0 9IN  GS21 (SUTURE) ×1
SUT VLOC 180 0 9IN GS21 (SUTURE) ×1 IMPLANT
SYR 50ML LL SCALE MARK (SYRINGE) ×2 IMPLANT
SYR CONTROL 10ML LL (SYRINGE) ×2 IMPLANT
SYSTEM CONVERTIBLE TROCAR (TROCAR) IMPLANT
TIP UTERINE 5.1X6CM LAV DISP (MISCELLANEOUS) IMPLANT
TIP UTERINE 6.7X10CM GRN DISP (MISCELLANEOUS) IMPLANT
TIP UTERINE 6.7X6CM WHT DISP (MISCELLANEOUS) IMPLANT
TIP UTERINE 6.7X8CM BLUE DISP (MISCELLANEOUS) IMPLANT
TOWEL OR 17X24 6PK STRL BLUE (TOWEL DISPOSABLE) ×6 IMPLANT
TRAY FOLEY CATH 14FR (SET/KITS/TRAYS/PACK) ×2 IMPLANT
TROCAR 12M 150ML BLUNT (TROCAR) IMPLANT
TROCAR BALLN 12MMX100 BLUNT (TROCAR) ×2 IMPLANT
TROCAR DISP BLADELESS 8 DVNC (TROCAR) ×1 IMPLANT
TROCAR DISP BLADELESS 8MM (TROCAR) ×1
TROCAR HASSON GELL 12X100 (TROCAR) IMPLANT
TROCAR XCEL 12X100 BLDLESS (ENDOMECHANICALS) ×2 IMPLANT
TROCAR XCEL NON-BLD 11X100MML (ENDOMECHANICALS) ×2 IMPLANT
TROCAR XCEL NON-BLD 5MMX100MML (ENDOMECHANICALS) ×4 IMPLANT
TUBING FILTER THERMOFLATOR (ELECTROSURGICAL) ×4 IMPLANT
WARMER LAPAROSCOPE (MISCELLANEOUS) ×2 IMPLANT
WATER STERILE IRR 1000ML POUR (IV SOLUTION) ×6 IMPLANT

## 2012-02-07 NOTE — Anesthesia Postprocedure Evaluation (Signed)
  Anesthesia Post-op Note  Patient: Linda Solis  Procedure(s) Performed: Procedure(s) (LRB) with comments: HYSTERECTOMY TOTAL LAPAROSCOPIC (N/A) - with Bilateral Salpingectomies  Patient is awake and responsive. Pain and nausea are reasonably well controlled. Vital signs are stable and clinically acceptable. Oxygen saturation is clinically acceptable. There are no apparent anesthetic complications at this time. Patient is ready for discharge.

## 2012-02-07 NOTE — Brief Op Note (Signed)
02/07/2012  11:15 AM  PATIENT:  Linda Solis  36 y.o. female  PRE-OPERATIVE DIAGNOSIS:  Dysfunctional Uterine Bleeding, Pelvic Pain, Failed NovaSure  POST-OPERATIVE DIAGNOSIS:  Dysfunctional Uterine Bleeding, Pelvic Pain, Failed NovaSure  PROCEDURE:  Procedure(s) (LRB) with comments: HYSTERECTOMY TOTAL LAPAROSCOPIC (N/A) - with Bilateral Salpingectomies  SURGEON:  Surgeon(s) and Role:    * Huy Majid A. Ernestina Penna, MD - Primary  PHYSICIAN ASSISTANT:   ASSISTANTSSeymour Bars, MD   ANESTHESIA:   general  EBL:  Total I/O In: -  Out: 350 [Urine:250; Blood:100]  BLOOD ADMINISTERED:none  DRAINS: Urinary Catheter (Foley)   LOCAL MEDICATIONS USED:  NONE  SPECIMEN:  Source of Specimen:  uterus, cervix, bilateral fallopian tubes  DISPOSITION OF SPECIMEN:  PATHOLOGY  COUNTS:  YES  TOURNIQUET:  * No tourniquets in log *  DICTATION: .Note written in EPIC  PLAN OF CARE: Admit for overnight observation  PATIENT DISPOSITION:  PACU - hemodynamically stable.   Delay start of Pharmacological VTE agent (>24hrs) due to surgical blood loss or risk of bleeding: yes

## 2012-02-07 NOTE — Preoperative (Signed)
Beta Blockers   Reason not to administer Beta Blockers:Hold beta blocker due to other; patient took medication at home 0400 this a.m.

## 2012-02-07 NOTE — Transfer of Care (Signed)
Immediate Anesthesia Transfer of Care Note  Patient: Linda Solis  Procedure(s) Performed: Procedure(s) (LRB) with comments: HYSTERECTOMY TOTAL LAPAROSCOPIC (N/A) - can drape the robot LAPAROSCOPIC ASSISTED VAGINAL HYSTERECTOMY (N/A) ROBOTIC ASSISTED TOTAL HYSTERECTOMY (N/A) HYSTERECTOMY ABDOMINAL (N/A)  Patient Location: PACU  Anesthesia Type: General  Level of Consciousness: awake, alert , oriented and patient cooperative  Airway & Oxygen Therapy: Patient Spontanous Breathing and Patient connected to nasal cannula oxygen  Post-op Assessment: Report given to PACU RN and Post -op Vital signs reviewed and stable  Post vital signs: Reviewed and stable  Complications: No apparent anesthesia complications

## 2012-02-07 NOTE — Op Note (Signed)
02/07/2012  11:15 AM  PATIENT:  Linda Solis  36 y.o. female  PRE-OPERATIVE DIAGNOSIS:  Dysfunctional Uterine Bleeding, Pelvic Pain, Failed NovaSure  POST-OPERATIVE DIAGNOSIS:  Dysfunctional Uterine Bleeding, Pelvic Pain, Failed NovaSure  PROCEDURE:  Procedure(s) (LRB) with comments: HYSTERECTOMY TOTAL LAPAROSCOPIC (N/A) - with Bilateral Salpingectomies  SURGEON:  Surgeon(s) and Role:    * Tamika Shropshire A. Ernestina Penna, MD - Primary  Findings: Normal liver edge, gastric band in place, small omental adhesions at the umbilicus, evidence of prior tubal ligation, normal ovaries bilaterally, normal fallopian tubes, normal uterus, no significant pelvic adhesions, bilateral peristalsing ureters both before and at the end of the procedure  ASSISTANTS: Seymour Bars, MD   ANESTHESIA:   general  EBL:  Total I/O In: -  Out: 350 [Urine:250; Blood:100]  BLOOD ADMINISTERED:none  DRAINS: Urinary Catheter (Foley)   LOCAL MEDICATIONS USED:  NONE  SPECIMEN:  Source of Specimen:  uterus, cervix, bilateral fallopian tubes  DISPOSITION OF SPECIMEN:  PATHOLOGY  COUNTS:  YES  TOURNIQUET:  * No tourniquets in log *  DICTATION: .Note written in EPIC  PLAN OF CARE: Admit for overnight observation  PATIENT DISPOSITION:  PACU - hemodynamically stable.    Indications: This is a 36 year old G2 P2 with history of dysfunctional uterine bleeding. 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 Patient then returned to the office 3 years later with vaginal bleeding and slight thickening of the endometrium was noted. Attempts to perform an in office ultrasound guided endometrial biopsy was unsuccessful due to the patient having an episode of convulsive syncope for which she needed to be transferred to the ER for evaluation. Patient was given option of progesterone therapy versus expected management with repeat ultrasound versus a second attempt at endometrial biopsy versus  hysterectomy. Given patient's history of pelvic pain, return of abnormal bleeding, and patient's concern of hyperplasia patient opted for a hysterectomy. Much discussion was had on the route of hysterectomy and eventually and attempted total laparoscopic hysterectomy was decided upon as the optimum route.  Procedure: After informed consent including discussion of risks of bleeding, infection, failure to achieve desired outcome and discussion of alternatives, the patient was taken to the operating room where general anesthesia was initiated without difficulty. She was prepped and draped in normal sterile fashion in the dorsal supine lithotomy position. A Foley catheter was inserted sterilely into the bladder. A bimanual examination was done to assess the size and position of the uterus. The speculum was inserted into the vagina. A single-tooth tenaculum was used to grasp the anterior lip of the cervix. The cervix was dilated to a #21 Warehouse manager. Some resistance was met as was anticipated given the preop diagnosis of intrauterine adhesions. A #8 trocar and a medium rumi cup was used. Of note a high rectocele was noted and vaginal tissue was bulging into the koh ring. At this point the manipulator was removed a suture was placed on the posterior cervix and the small Co ring and was then placed with a plan for the vaginal  manipulation to include pulling back on the posterior stitch. The balloon was then inflated  Attention was then turned to the patient's abdomen. 0.5 % marcaine was used prior to all incision. A total of 10 cc of marcaine was used.  A 10 mm incision was made in the umbilicus and blunt and sharp dissection was done until the fascia was identified. Great care was made to avoid the port to her prior gastric  lap band . The fascia was then grasped with Kocher clamps x2 and entered sharply. A pursestring suture of 0 Vicryl was then placed along the incision and a non-bladed Roseanne Reno was inserted into  the peritoneal cavity. Intraperitoneal placement was confirmed with the use of the camera and pneumoperitoneum was created to 15 mm of mercury. The pursestring suture was secured around the port and pneumoperitoneum was maintained. Brief survey of the abdomen and pelvis was done with findings as above. The abdominal wall was assessed and additional port sites were marked. 5 mm incisions were placed in the right and left lower quadrants.  Ports were placed under direct visualization.   After identifying the bilateral peristalsing ureters we began on the right side. The gyrus device was used to serially cauterized and transected the right fallopian tube from the underlying mesial salpinx tissue. The uterine ovarian ligament was then serially cauterized and transected. The ovary then fell away from the field of operation. The right round ligament was serially grasped and cauterized and transected. The anterior leaf of the broad ligament was dissected out. This was carried around to the bladder flap. The bladder flap was created sharply. The right uterine artery was then dissected out. It was seen clearly and serially cauterized. Attention was then turned to the left side. the left tube was grasped and serial cauterization and  transection was done along the mesial salpinx. The tube was free. The left utero-ovarian ligament was serially cauterized and transected. The left ovary then fell away from the operative field. The left round ligament was serially cauterized and transected. The anterior leaf of the broad ligament was opened and dissected around to the bladder flap. The bladder flap was created sharply. The left uterine vessels were serially cauterized and transected. Good blanching of the uterus was noted additional cautery and transection was done along some posterior branches of the uterine artery. Attention was then turned to the right uterine artery this had been cauterized prior. Additional cautery and  transection was done. Additional dissection was done at the bladder flap. Assessment of the posterior cuff was then done and felt to be free of any significant vessels. With pressure on the rumi koh ring the gyrus spatula blade was used on cutting current to come along the anterior colpotomy just on the ring. Some bleeding was noted at both angles and this was controlled with the gyrus on a coag current. The colpotomy was carried from the midline in the center around the left, around posterior, to the right angle and then back to the center anteriorly. The uterus and the fallopian tube segments were then removed from the vagina. The vaginal cuff was reevaluated and cautery with the gyrus bipolar was used to assure hemostasis. Once hemostasis was assured decision was made to close the vaginal cuff. A VueLock suture was placed through the umbilicus report. A self riting needle driver wasused from the right lower quadrant port to throw a running line of VueLock sutures. Once the the locked stitch was all the way across to the left angle a [place] grasper was used to pull on the serial interrupted sutures to ensure they were tight. Once at the left angle the VueLock suture was inadvertently cut. As we were unable to run the suture back towards the midline the decision was made to place a Lapra-Ty on the free and at the left angle. This was done without complication. Once the vaginal cuff was closed a hand was placed in the vagina to  ensure tightness of the closure. We were satisfied with the results.  All pedicles were reinspected and found to be hemostatic the suture was removed from the umbilical port. Suction irrigation was used to further evaluate the pedicles and ensure no active bleeding. Irrigation was carried out and all of fluid was removed. fluid was clear by the end of the irrigation. All instruments were then removed from the abdomen. Pneumoperitoneum was released. The pursestring suture at the umbilicus  was then closed and the remaining skin incisions were closed with 4-0 Vicryl in a subcuticular fashion. Assessment of the vagina revealed no active bleeding and again good closure. The case was then terminated  Sponge lap and needle counts were correct x3. Patient was taken to room in stable condition  Tahjae Clausing A. 02/07/2012 11:33 AM

## 2012-02-07 NOTE — H&P (Signed)
See scanned H&P in media tab  Pt w/ persistent vaginal bleeding 2 yrs after Novasure. Also 3 prior l'scopes. Pt desires definitive treatment w/ hysterectomy. Plan ovarian conservation. Plan dx l'[scope then assessment of ability to complete traditional l'scope vs robotic vs open.   Linda Solis A. 02/07/2012 7:23 AM

## 2012-02-08 MED ORDER — OXYCODONE-ACETAMINOPHEN 5-325 MG PO TABS: 1.0000 | ORAL_TABLET | ORAL | Status: DC | PRN

## 2012-02-08 MED ORDER — IBUPROFEN 600 MG PO TABS: 600.0000 mg | ORAL_TABLET | Freq: Four times a day (QID) | ORAL | Status: DC | PRN

## 2012-02-08 NOTE — Progress Notes (Signed)
Pt is discharged in the care of husband. Downstairs per ambulatory. Stable Abdominal lapsite x 3 are clean and dry. . Denies any pain or discomfort. States nausea is better. Spirits good. Understood all discharge instructions well Questions asked and answered.

## 2012-02-08 NOTE — Progress Notes (Signed)
Post Operative Daily Note  1 Day Post-Op Procedure(s) (LRB): HYSTERECTOMY TOTAL LAPAROSCOPIC (N/A), b/l salpingectomy  Subjective: Patient reports nausea this am but no emesis. tol reg po including hamburger last night. On po pain meds, controlling well. + amb, + void. Minimal VB  Objective: I have reviewed patient's vital signs.  vital signs. Filed Vitals:   02/08/12 0223  BP: 102/60  Pulse: 68  Temp: 98.4 F (36.9 C)  Resp: 17   I/O last 3 completed shifts: In: 3320 [P.O.:720; I.V.:2600] Out: 1950 [Urine:1850; Blood:100]      EXAM General: alert and cooperative Resp: clear to auscultation bilaterally Cardio: regular rate and rhythm, S1, S2 normal, no murmur, click, rub or gallop GI: normal findings: appropriate tenderness, soft, non distension Extremities: edema none Vaginal Bleeding: none Inc: well approximated, dermabond in place  Assessment: s/p Procedure(s): HYSTERECTOMY TOTAL LAPAROSCOPIC: stable  Plan: Discharge home  LOS: 1 day    Shahin Knierim A., MD 02/08/2012 8:11 AM    02/08/2012, 8:11 AM

## 2012-02-08 NOTE — Discharge Summary (Signed)
Linda Solis MRN: [id number] DOB/AGE: Jun 25, 1975 36 y.o.  Admit date: 02/07/2012 Discharge date: 02/08/12  Admission Diagnoses: Dysfunctional Uterine Bleeding, Pelvic Pain, Failed NovaSure  Discharge Diagnoses: Dysfunctional Uterine Bleeding, Pelvic Pain, Failed NovaSure        Active Problems:  * No active hospital problems. *    Discharged Condition: good  Hospital Course: admitted for TLH/ b/l salpingectomy, uncomplicated intra-op and post-opcourse  Consults: None  Treatments: surgery: TLH  Disposition: 01-Home or Self Care     Medication List     As of 02/08/2012  8:17 AM    TAKE these medications         butorphanol 10 MG/ML nasal spray   Commonly known as: STADOL   Place 1 spray into the nose every 4 (four) hours as needed. For migraines      clonazePAM 1 MG tablet   Commonly known as: KLONOPIN   Take 1.5 mg by mouth at bedtime.      ibuprofen 600 MG tablet   Commonly known as: ADVIL,MOTRIN   Take 1 tablet (600 mg total) by mouth every 6 (six) hours as needed (mild pain).      oxyCODONE-acetaminophen 5-325 MG per tablet   Commonly known as: PERCOCET/ROXICET   Take 1-2 tablets by mouth every 4 (four) hours as needed (moderate to severe pain (when tolerating fluids)).      promethazine 25 MG tablet   Commonly known as: PHENERGAN   Take 25 mg by mouth every 6 (six) hours as needed. For nausea associated with migraines      propranolol 80 MG tablet   Commonly known as: INDERAL   Take 80-160 mg by mouth 2 (two) times daily. Takes 160 mg (2 tabs) in am and 80 mg (1 tab) in pm      traZODone 100 MG tablet   Commonly known as: DESYREL   Take 150 mg by mouth at bedtime as needed. For insomnia         Signed: Lendon Colonel., MD 02/08/2012, 8:17 AM

## 2012-02-09 ENCOUNTER — Encounter (HOSPITAL_COMMUNITY): Payer: Self-pay | Admitting: Obstetrics

## 2012-05-27 ENCOUNTER — Telehealth: Payer: Self-pay | Admitting: Internal Medicine

## 2012-05-27 NOTE — Telephone Encounter (Signed)
S/W pt in re NP appt 01/21 @ 1:30 w/Dr. Arbutus Ped.  Referring Dr. Noland Fordyce Dx-Ovarian Vein Thrombosis; Septic Pelvis Thrombosis Welcome packet @ registration.

## 2012-05-28 ENCOUNTER — Ambulatory Visit (HOSPITAL_BASED_OUTPATIENT_CLINIC_OR_DEPARTMENT_OTHER): Payer: BC Managed Care – PPO | Admitting: Internal Medicine

## 2012-05-28 ENCOUNTER — Encounter: Payer: Self-pay | Admitting: Internal Medicine

## 2012-05-28 ENCOUNTER — Ambulatory Visit: Payer: BC Managed Care – PPO

## 2012-05-28 ENCOUNTER — Other Ambulatory Visit (HOSPITAL_BASED_OUTPATIENT_CLINIC_OR_DEPARTMENT_OTHER): Payer: BC Managed Care – PPO | Admitting: Lab

## 2012-05-28 VITALS — BP 119/84 | HR 64 | Temp 98.1°F | Resp 18 | Ht 66.0 in | Wt 221.1 lb

## 2012-05-28 DIAGNOSIS — M549 Dorsalgia, unspecified: Secondary | ICD-10-CM

## 2012-05-28 DIAGNOSIS — I749 Embolism and thrombosis of unspecified artery: Secondary | ICD-10-CM

## 2012-05-28 DIAGNOSIS — I82890 Acute embolism and thrombosis of other specified veins: Secondary | ICD-10-CM | POA: Insufficient documentation

## 2012-05-28 MED ORDER — OXYCODONE-ACETAMINOPHEN 5-325 MG PO TABS
1.0000 | ORAL_TABLET | Freq: Four times a day (QID) | ORAL | Status: DC | PRN
Start: 2012-05-28 — End: 2019-09-02

## 2012-05-28 MED ORDER — ENOXAPARIN SODIUM 150 MG/ML ~~LOC~~ SOLN: 150.0000 mg | Freq: Every day | SUBCUTANEOUS | Status: DC

## 2012-05-28 NOTE — Progress Notes (Signed)
Checked in new pt with no financial concerns. °

## 2012-05-28 NOTE — Patient Instructions (Signed)
Continue treatment with Lovenox but will adjust his dose to be therapeutic for the next 3 months. Followup with your primary care physician and gynecologist as scheduled.

## 2012-05-28 NOTE — Progress Notes (Signed)
[facility] [phone]   [phone]  CONSULT NOTE  REFERRING PHYSICIAN: Dr. 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  HPI Linda Solis is a 37 y.o. female was no significant past medical history except for migraine headache as well as history of cholecystectomy and recent total hysterectomy with bilateral salpingectomies in October of 2013. The patient has been doing fine until a few days ago when she started having back pain with radiation to the right lower quadrant. She initially thought that she had kidney problem. Her pain was more localized to the right lower quadrant on the following day. It was not associated with any fever or chills, no nausea or vomiting. The patient went to the emergency Department at [facility] in [place]. CT scan of the abdomen and pelvis was performed on 05/25/2012 and it showed acute and appearing thrombosis of the right gonadal vein with associated inflammatory stranding in the adjacent fat. The patient had normal appendix. She was started on treatment with Lovenox 40 mg subcutaneously twice a day. She was seen yesterday by Dr. Lavon Paganini and was referred to me today for further evaluation and recommendation regarding her condition. The patient is feeling fine today but continues to have mild pain in the right lower quadrant and she is currently on Percocet on as-needed basis. She also complained of constipation and she used an enema twice recently with some improvement. She denied having any significant fever or chills. She has mild nausea. She denied having any significant chest pain, shortness breath, cough or hemoptysis. Family history significant for mother who had stroke and father died from heart disease and diabetes mellitus. The patient is married and has 2 children. 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 She has no history of smoking or alcohol abuse. No history of drug abuse. @SFHPI @  Past Medical History  Diagnosis Date  . Headache     migraines  . Anxiety   . Depression   . Sleep apnea     has CPAP - does not use    Past Surgical History  Procedure Date  . Tubal ligation   . Laparoscopic gastric banding 2009  . Cholecystectomy   . Novasure ablation   . Laparoscopic hysterectomy 02/07/2012    Procedure: HYSTERECTOMY TOTAL LAPAROSCOPIC;  Surgeon: Tresa Endo A. Ernestina Penna, MD;  Location: [facility];  Service: Gynecology;  Laterality: N/A;  with Bilateral Salpingectomies    History reviewed. No pertinent family history.  Social History History  Substance Use Topics  . Smoking status: Never Smoker   . Smokeless tobacco: Not on file  . Alcohol Use: No    Allergies  Allergen Reactions  . Cephalosporins Nausea And Vomiting  . Epinephrine-Lidocaine-Na Metabisulfite (Lidocaine-Epinephrine) Other (See Comments)    Patient passed out & had seizure-like activity Patient was having a endometrial biopsy and medication was injected locally.  . Penicillins Hives and Rash  . Sumatriptan Palpitations    Patient avoids all -triptans Chest pain & Heart palpitations    Current Outpatient Prescriptions  Medication Sig Dispense Refill  . butorphanol (STADOL) 10 MG/ML nasal spray Place 1 spray into the nose every 4 (four) hours as needed. For migraines      . clonazePAM (KLONOPIN) 1 MG tablet Take 1.5 mg by mouth at bedtime.       . cloNIDine (CATAPRES - DOSED IN MG/24 HR) 0.1  mg/24hr patch       . oxyCODONE-acetaminophen (PERCOCET/ROXICET) 5-325 MG per tablet Take 1 tablet by mouth every 6 (six) hours as needed (moderate to severe pain (when tolerating fluids)).  30 tablet  0  . promethazine (PHENERGAN) 25 MG tablet Take 25 mg by mouth every 6 (six) hours as needed. For nausea associated with migraines      . propranolol (INDERAL) 80 MG tablet Take 80-160 mg by mouth 2 (two)  times daily. Takes 160 mg (2 tabs) in am and 80 mg (1 tab) in pm      . traZODone (DESYREL) 100 MG tablet Take 150 mg by mouth at bedtime as needed. For insomnia      . enoxaparin (LOVENOX) 150 MG/ML injection Inject 1 mL (150 mg total) into the skin daily.  30 Syringe  2    Review of Systems  A comprehensive review of systems was negative except for: Gastrointestinal: positive for abdominal pain and constipation  Physical Exam  OZH:YQMVH, healthy, no distress, well nourished and well developed SKIN: skin color, texture, turgor are normal HEAD: Normocephalic, No masses, lesions, tenderness or abnormalities EYES: normal, PERRLA EARS: External ears normal OROPHARYNX:no exudate and no erythema  NECK: supple, no adenopathy LYMPH:  no palpable lymphadenopathy, no hepatosplenomegaly BREAST:not examined LUNGS: clear to auscultation  HEART: regular rate & rhythm, no murmurs and no gallops ABDOMEN:abdomen soft, non-tender, normal bowel sounds and no masses or organomegaly BACK: Back symmetric, no curvature. EXTREMITIES:no joint deformities, effusion, or inflammation, no edema, no skin discoloration  NEURO: alert & oriented x 3 with fluent speech, no focal motor/sensory deficits  PERFORMANCE STATUS: ECOG 1  LABORATORY DATA: Lab Results  Component Value Date   WBC 11.9* 01/29/2012   HGB 14.1 01/29/2012   HCT 40.5 01/29/2012   MCV 88.8 01/29/2012   PLT 371 01/29/2012      Chemistry      Component Value Date/Time   NA 135 02/07/2012 0620   K 3.8 02/07/2012 0620   CL 100 02/07/2012 0620   CO2 25 02/07/2012 0620   BUN 10 02/07/2012 0620   CREATININE 0.80 02/07/2012 0620      Component Value Date/Time   CALCIUM 9.2 02/07/2012 0620       RADIOGRAPHIC STUDIES: No results found.  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  PLAN: I have a lengthy discussion with the  patient today about her condition. I explained to the patient that there are a few reports of pulmonary embolism secondary to gonadal vein thrombosis. I advised her to consider anticoagulation for at least 3 months. She is currently on Lovenox but prophylactic dose. I will change her dose of Lovenox to 1.5 mg/kg subcutaneously on daily basis for the next 3 months. This would be equal to 150 mg subcutaneously on daily basis. The patient will continue her routine followup visit with her primary care physician and gynecologist as scheduled. I don't see a need to order a hypercoagulable panel at this point as her thrombosis is likely secondary to her recent surgery. For pain management the patient was given on refill of her pain medication of Percocet 5/325 mg by mouth every 6 hours as needed #30. I did not give the patient a followup appointment but will be happy to see her in the future if needed. The patient and her husband agreed to the current plan.  All questions were  answered. The patient knows to call the clinic with any problems, questions or concerns. We can certainly see the patient much sooner if necessary.  Thank you so much for allowing me to participate in the care of [facility]. I will continue to follow up the patient with you and assist in her care.  I spent 25 minutes counseling the patient face to face. The total time spent in the appointment was 50 minutes.  Ario Mcdiarmid K. 05/28/2012, 2:58 PM

## 2013-03-30 IMAGING — CT CT HEAD W/O CM
1 series · 16 of 30 positions shown, 20 images · non-contrast
Comparison: None.

CLINICAL DATA: Altered mental status, seizure

CT HEAD WITHOUT CONTRAST
TECHNIQUE: Contiguous axial images were obtained from the base of
the skull through the vertex without contrast.

[Series 2: head routine 4.8 h37s · axial · 0.47mm/px · z∈[+1274,+1434]mm · 16 of 36 slices shown, 20 images]
[im 2/36  brain]
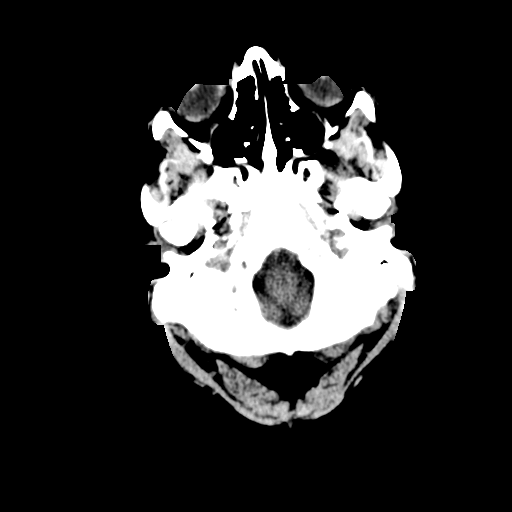
[im 2/36  bone]
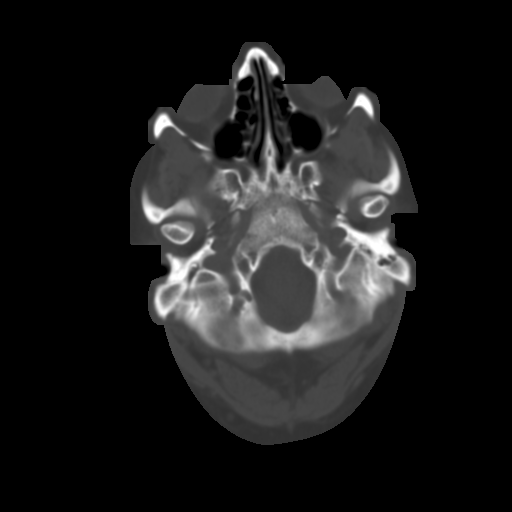
[im 4/36  brain]
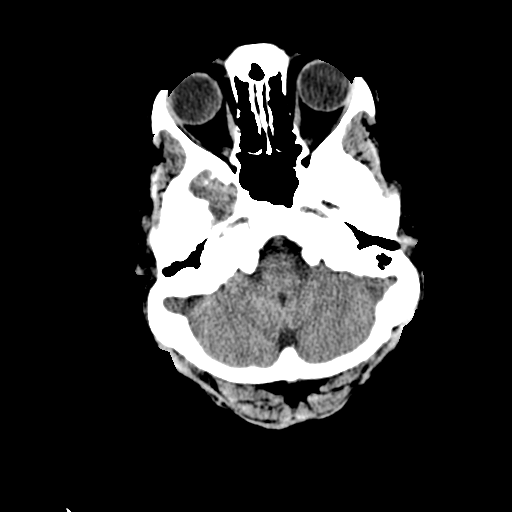
[im 7/36  brain]
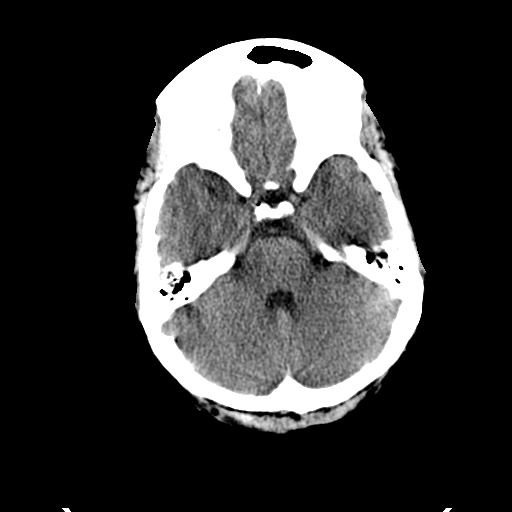
[im 9/36  brain]
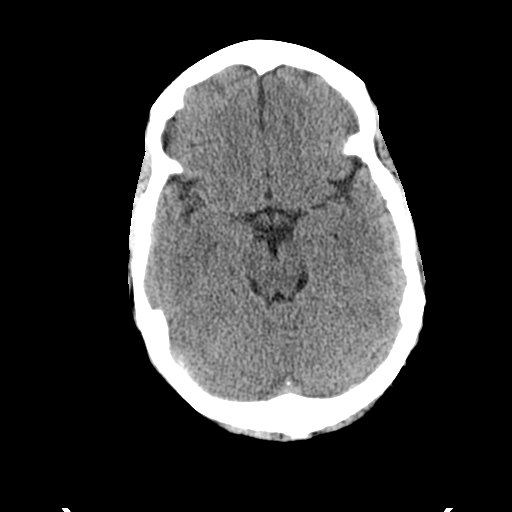
[im 10/36  brain]
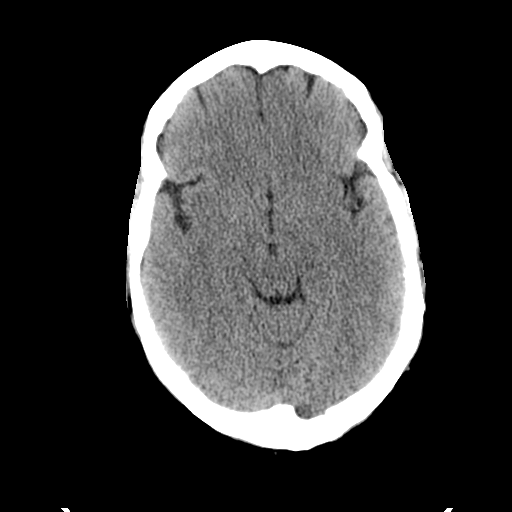
[im 10/36  bone]
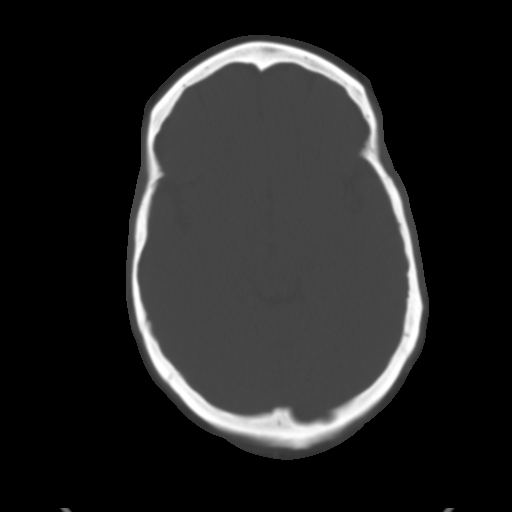
[im 13/36  brain]
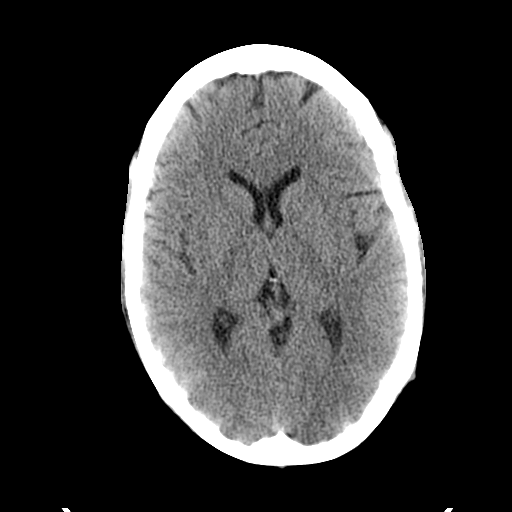
[im 15/36  brain]
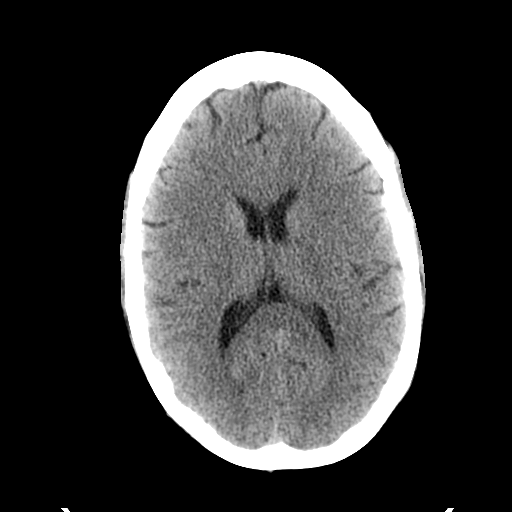
[im 17/36  brain]
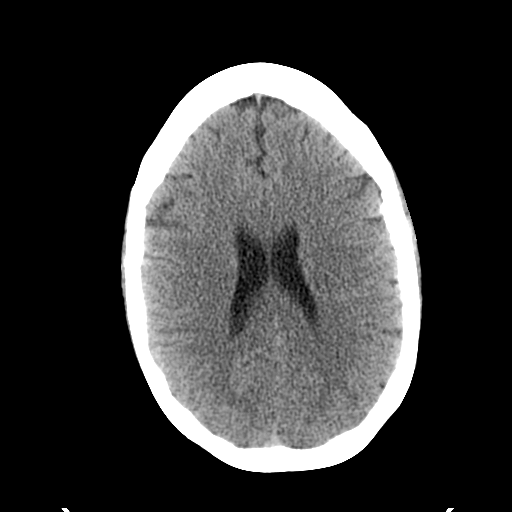
[im 19/36  brain]
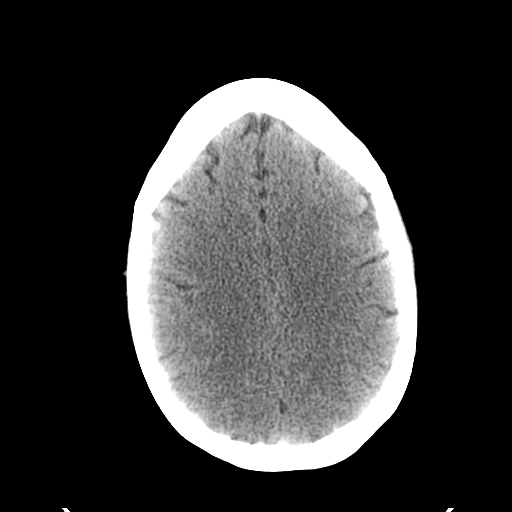
[im 19/36  bone]
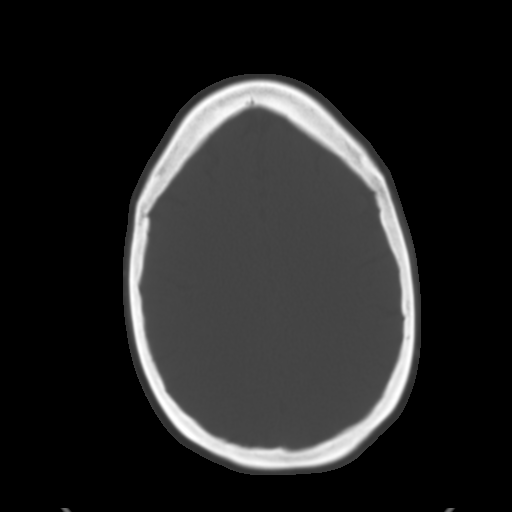
[im 21/36  brain]
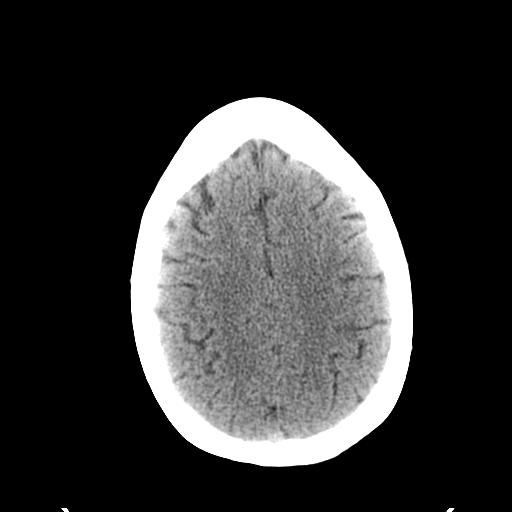
[im 23/36  brain]
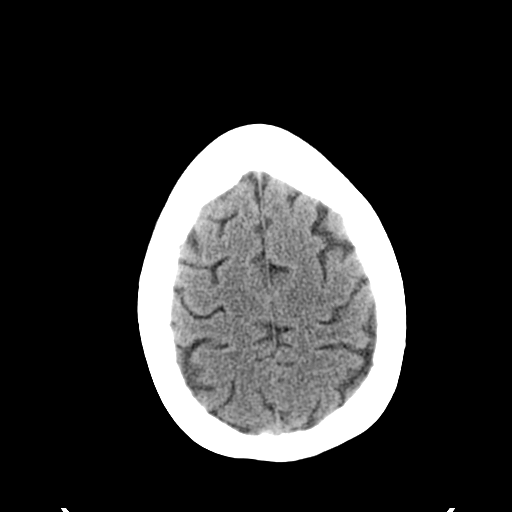
[im 26/36  brain]
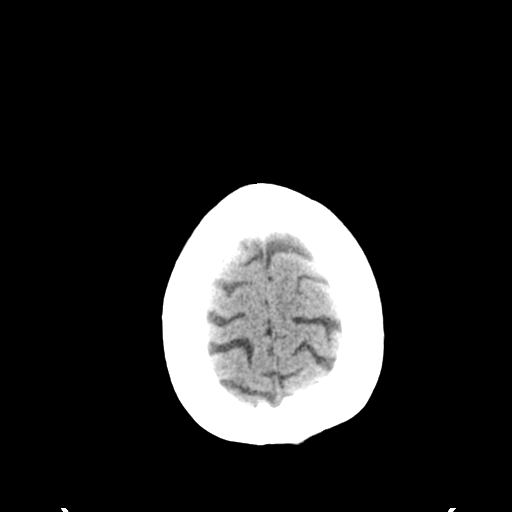
[im 27/36  brain]
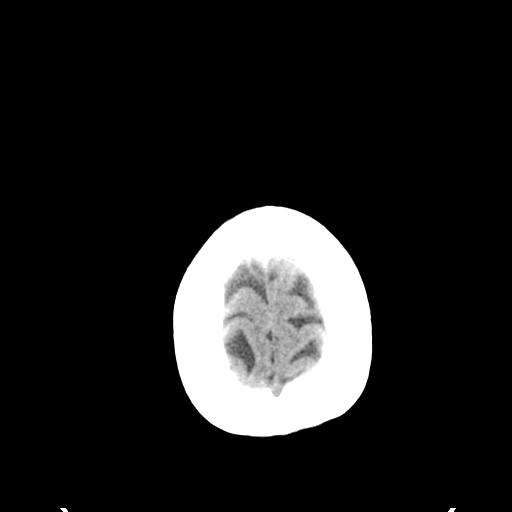
[im 27/36  bone]
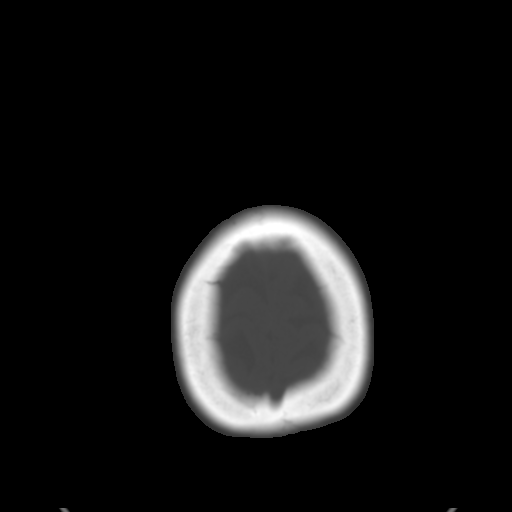
[im 29/36  brain]
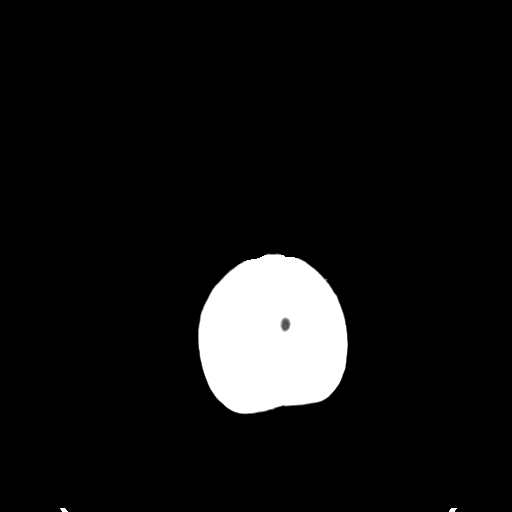
[im 32/36  brain]
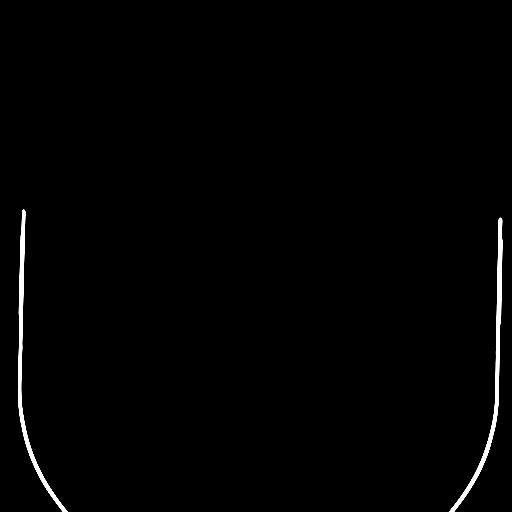
[im 34/36  brain]
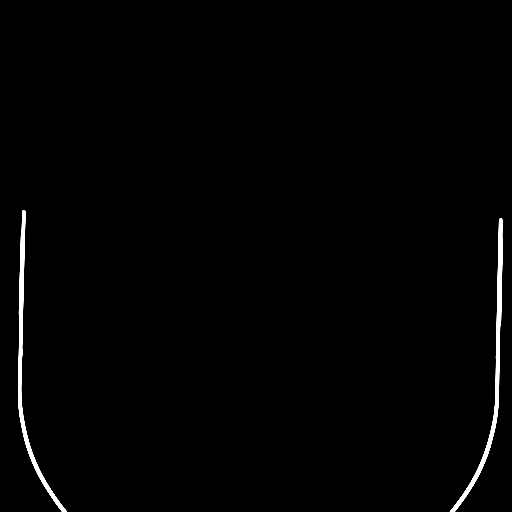

[16 of 30 positions shown; findings below may reference images not displayed]

FINDINGS: No acute intracranial hemorrhage.  No focal mass lesion.
No CT evidence of acute infarction.   No midline shift or mass
effect.  No hydrocephalus.  Basilar cisterns are patent. Paranasal
sinuses and mastoid air cells are clear.  Orbits are normal.
IMPRESSION: Normal CT  head

## 2013-12-31 ENCOUNTER — Other Ambulatory Visit: Payer: Self-pay | Admitting: Physician Assistant

## 2014-09-02 ENCOUNTER — Ambulatory Visit
Admit: 2014-09-02 | Disposition: A | Discharge status: Home / Self Care | Payer: Self-pay | Attending: Unknown Physician Specialty | Admitting: Unknown Physician Specialty

## 2014-09-02 ENCOUNTER — Other Ambulatory Visit: Payer: Self-pay

## 2014-10-30 LAB — SURGICAL PATHOLOGY

## 2018-03-21 ENCOUNTER — Ambulatory Visit: Payer: BLUE CROSS/BLUE SHIELD | Admitting: Orthopaedic Surgery

## 2018-03-21 ENCOUNTER — Encounter: Payer: Self-pay | Admitting: Orthopaedic Surgery

## 2018-03-21 VITALS — BP 128/78 | HR 76 | Ht 66.0 in | Wt 200.0 lb

## 2018-03-21 DIAGNOSIS — G8929 Other chronic pain: Secondary | ICD-10-CM

## 2018-03-21 DIAGNOSIS — M25562 Pain in left knee: Secondary | ICD-10-CM

## 2018-03-21 NOTE — Progress Notes (Signed)
Subjective:    Patient ID: Linda Solis, female    DOB: 08-22-1975, 42 y.o.   MRN: [id number]  HPI She is here today to get an evaluation and opinion about her left knee.  She has a long history of left knee pain. She had arthroscopy of the left knee last year and again earlier this year by a doctor in [place].  She has continued pain.  She saw Dr. Ronnie Derby in [place] earlier this month and even this week about her left knee.  He got a MRI showing changes of the root of the medial meniscus consistent with new tear.  She has changes post operative medially and laterally for partial menisectomies.  She has significant loss of articular cartilage of the lateral tibial plateau on the left and also of the distal femoral condyle.  Dr. Ronnie Derby recommended exercises and also stated a possible drilling procedure into the bone.  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  Her knee swells, hurts, pops and is not getting better.  She does not want to go back to the surgeon who did her surgery in [place].  She has copies of the MRI done just recently and only one note from Dr. Ronnie Derby. She does not have copies of the op notes or physician notes from her surgeon in [place].  She wanted me to explain what the MRI report stated, and what possible options she has.    She is 42.  Dr. Ronnie Derby mentioned a possible total knee in the near future also.  She is very concerned about what to do and how to proceed.  I told her she is very young to consider a total knee.  I have told her I need her complete records.  She will obtain them.  Her situation is not an easy one to solve.  I have talked about visco supplementation.  I have suggested she go to one of the nearby medical schools for further evaluation by one of the attendings.  She is agreeable to this.  I spent 45 minutes in talking to her and going over what  documentation I had today.  She needs to go and get the full records.   Review of Systems  Constitutional: Positive for activity change.  Musculoskeletal: Positive for arthralgias, gait problem and joint swelling.  Neurological: Positive for headaches.  Psychiatric/Behavioral: The patient is nervous/anxious.   All other systems reviewed and are negative.  For Review of Systems, all other systems reviewed and are negative.  The following is a summary of the past history medically, past history surgically, known current medicines, social history and family history.  This information is gathered electronically by the computer from prior information and documentation.  I review this each visit and have found including this information at this point in the chart is beneficial and informative.   Past Medical History:  Diagnosis Date  . Anxiety   . Depression   . Headache(784.0)    migraines  . Sleep apnea    has CPAP - does not use    Past Surgical History:  Procedure Laterality Date  . CHOLECYSTECTOMY    . LAPAROSCOPIC GASTRIC BANDING  2009  . LAPAROSCOPIC HYSTERECTOMY  02/07/2012   Procedure: HYSTERECTOMY TOTAL LAPAROSCOPIC;  Surgeon: Claiborne Billings A. Pamala Hurry, MD;  Location: [facility];  Service: Gynecology;  Laterality: N/A;  with Bilateral Salpingectomies  . NOVASURE  ABLATION    . TUBAL LIGATION      Current Outpatient Medications on File Prior to Visit  Medication Sig Dispense Refill  . butorphanol (STADOL) 10 MG/ML nasal spray Place 1 spray into the nose every 4 (four) hours as needed. For migraines    . clonazePAM (KLONOPIN) 1 MG tablet Take 1.5 mg by mouth at bedtime.     . promethazine (PHENERGAN) 25 MG tablet Take 25 mg by mouth every 6 (six) hours as needed. For nausea associated with migraines    . propranolol (INDERAL) 80 MG tablet Take 80-160 mg by mouth 2 (two) times daily. Takes 160 mg (2 tabs) in am and 80 mg (1 tab) in pm    . cloNIDine (CATAPRES - DOSED IN MG/24 HR) 0.1  mg/24hr patch     . oxyCODONE-acetaminophen (PERCOCET/ROXICET) 5-325 MG per tablet Take 1 tablet by mouth every 6 (six) hours as needed (moderate to severe pain (when tolerating fluids)). (Patient not taking: Reported on 03/21/2018) 30 tablet 0  . traZODone (DESYREL) 100 MG tablet Take 150 mg by mouth at bedtime as needed. For insomnia     No current facility-administered medications on file prior to visit.     Social History   Socioeconomic History  . Marital status: Married    Spouse name: Not on file  . Number of children: Not on file  . Years of education: Not on file  . Highest education level: Not on file  Occupational History  . Not on file  Social Needs  . Financial resource strain: Not on file  . Food insecurity:    Worry: Not on file    Inability: Not on file  . Transportation needs:    Medical: Not on file    Non-medical: Not on file  Tobacco Use  . Smoking status: Never Smoker  . Smokeless tobacco: Never Used  Substance and Sexual Activity  . Alcohol use: No  . Drug use: No  . Sexual activity: Yes    Birth control/protection: Surgical  Lifestyle  . Physical activity:    Days per week: Not on file    Minutes per session: Not on file  . Stress: Not on file  Relationships  . Social connections:    Talks on phone: Not on file    Gets together: Not on file    Attends religious service: Not on file    Active member of club or organization: Not on file    Attends meetings of clubs or organizations: Not on file    Relationship status: Not on file  . Intimate partner violence:    Fear of current or ex partner: Not on file    Emotionally abused: Not on file    Physically abused: Not on file    Forced sexual activity: Not on file  Other Topics Concern  . Not on file  Social History Narrative  . Not on file    Family History  Problem Relation Age of Onset  . Stroke Mother   . Diabetes Father   . High blood pressure Father     BP 128/78   Pulse 76   Ht  5\' 6"  (1.676 m)   Wt 200 lb (90.7 kg)   BMI 32.28 kg/m   Body mass index is 32.28 kg/m.     Objective:   Physical Exam  Constitutional: She is oriented to person, place, and time. She appears well-developed and well-nourished.  HENT:  Head: Normocephalic and atraumatic.  Eyes:  Pupils are equal, round, and reactive to light. Conjunctivae and EOM are normal.  Neck: Normal range of motion. Neck supple.  Cardiovascular: Normal rate, regular rhythm and intact distal pulses.  Pulmonary/Chest: Effort normal.  Abdominal: Soft.  Musculoskeletal:       Left knee: She exhibits swelling and effusion. Tenderness found. Medial joint line tenderness noted.       Legs: Neurological: She is alert and oriented to person, place, and time. She has normal reflexes. She displays normal reflexes. No cranial nerve deficit. She exhibits normal muscle tone. Coordination normal.  Skin: Skin is warm and dry.  Psychiatric: She has a normal mood and affect. Her behavior is normal. Judgment and thought content normal.          Assessment & Plan:   Encounter Diagnosis  Name Primary?  . Chronic pain of left knee Yes   Get medical records.  Consider going to medical school for further evaluation.  Electronically Signed Sanjuana Kava, MD 11/14/201911:16 AM

## 2018-03-27 ENCOUNTER — Encounter: Payer: Self-pay | Admitting: Orthopaedic Surgery

## 2018-03-27 ENCOUNTER — Ambulatory Visit: Payer: BLUE CROSS/BLUE SHIELD | Admitting: Orthopaedic Surgery

## 2018-03-27 VITALS — BP 138/93 | HR 67 | Ht 66.0 in | Wt 205.0 lb

## 2018-03-27 DIAGNOSIS — G8929 Other chronic pain: Secondary | ICD-10-CM | POA: Diagnosis not present

## 2018-03-27 DIAGNOSIS — M25562 Pain in left knee: Secondary | ICD-10-CM | POA: Diagnosis not present

## 2018-03-27 NOTE — Progress Notes (Signed)
CC:  Left knee pain.  She brings in the surgical pictures and operative notes of her surgeries in [place] last year and this year of the left knee.  She had partial menisectomies of the left knee medial and lateral both times.  She continues to have pain, swelling and it interferes with her activity and lifestyle.  I again spent a good amount of time, 30 minutes today, going over her reports and explaining that there is no quick easy answer to her problem of DJD of the left knee at age 42.  I have told her that total joints are usually not recommended for her age group. The initial surgery is not the problem but the problem is potential loosening in the future and problems with revisions.  She wants to resume activities with no pain.  Viscosupplementation could be done but results are variable.  I have recommended again she be seen at one of the nearby medical schools for their evaluation and recommendations. I can help facilitate this and make the referral.  She will discuss this further with her husband and family.  I have again suggested she get on the web and look up this on the [facility] site.  It has good pictures and in plain [place] discussion about it.  She will contact me with her decision.  Electronically [place], MD 11/20/20193:56 PM

## 2018-06-04 ENCOUNTER — Other Ambulatory Visit: Payer: Self-pay | Admitting: Physician Assistant

## 2018-07-04 ENCOUNTER — Emergency Department (HOSPITAL_COMMUNITY): Payer: BLUE CROSS/BLUE SHIELD

## 2018-07-04 ENCOUNTER — Other Ambulatory Visit: Payer: Self-pay

## 2018-07-04 ENCOUNTER — Encounter (HOSPITAL_COMMUNITY): Payer: Self-pay | Admitting: Emergency Medicine

## 2018-07-04 ENCOUNTER — Emergency Department (HOSPITAL_COMMUNITY)
Admission: EM | Admit: 2018-07-04 | Discharge: 2018-07-04 | Disposition: A | Discharge status: Home / Self Care | Payer: BLUE CROSS/BLUE SHIELD | Attending: Emergency Medicine | Admitting: Emergency Medicine

## 2018-07-04 DIAGNOSIS — M79605 Pain in left leg: Secondary | ICD-10-CM | POA: Diagnosis not present

## 2018-07-04 DIAGNOSIS — Z79899 Other long term (current) drug therapy: Secondary | ICD-10-CM | POA: Insufficient documentation

## 2018-07-04 LAB — CBC WITH DIFFERENTIAL/PLATELET
Abs Immature Granulocytes: 0.04 10*3/uL (ref 0.00–0.07)
Basophils Absolute: 0.1 10*3/uL (ref 0.0–0.1)
Basophils Relative: 1 %
EOS ABS: 0.6 10*3/uL — AB (ref 0.0–0.5)
EOS PCT: 6 %
HEMATOCRIT: 35.7 % — AB (ref 36.0–46.0)
Hemoglobin: 11.6 g/dL — ABNORMAL LOW (ref 12.0–15.0)
Immature Granulocytes: 0 %
Lymphocytes Relative: 33 %
Lymphs Abs: 3.4 10*3/uL (ref 0.7–4.0)
MCH: 30.2 pg (ref 26.0–34.0)
MCHC: 32.5 g/dL (ref 30.0–36.0)
MCV: 93 fL (ref 80.0–100.0)
MONO ABS: 0.8 10*3/uL (ref 0.1–1.0)
Monocytes Relative: 7 %
NEUTROS ABS: 5.6 10*3/uL (ref 1.7–7.7)
NEUTROS PCT: 53 %
NRBC: 0 % (ref 0.0–0.2)
Platelets: 304 10*3/uL (ref 150–400)
RBC: 3.84 MIL/uL — ABNORMAL LOW (ref 3.87–5.11)
RDW: 12.8 % (ref 11.5–15.5)
WBC: 10.5 10*3/uL (ref 4.0–10.5)

## 2018-07-04 LAB — BASIC METABOLIC PANEL
ANION GAP: 6 (ref 5–15)
BUN: 19 mg/dL (ref 6–20)
CHLORIDE: 106 mmol/L (ref 98–111)
CO2: 27 mmol/L (ref 22–32)
Calcium: 9.1 mg/dL (ref 8.9–10.3)
Creatinine, Ser: 1.21 mg/dL — ABNORMAL HIGH (ref 0.44–1.00)
GFR calc non Af Amer: 55 mL/min — ABNORMAL LOW (ref >60)
Glucose, Bld: 96 mg/dL (ref 70–99)
POTASSIUM: 4.6 mmol/L (ref 3.5–5.1)
SODIUM: 139 mmol/L (ref 135–145)

## 2018-07-04 LAB — D-DIMER, QUANTITATIVE: D-Dimer, Quant: 0.5 ug/mL-FEU (ref 0.00–0.50)

## 2018-07-04 MED ORDER — OXYCODONE-ACETAMINOPHEN 5-325 MG PO TABS
1.0000 | ORAL_TABLET | Freq: Once | ORAL | Status: AC
  Administered 2018-07-04: 1 via ORAL
  Filled 2018-07-04: qty 1

## 2018-07-04 MED ORDER — HYDROMORPHONE HCL 1 MG/ML IJ SOLN
1.0000 mg | Freq: Once | INTRAMUSCULAR | Status: AC
  Administered 2018-07-04: 1 mg via INTRAVENOUS
  Filled 2018-07-04: qty 1

## 2018-07-04 MED ORDER — MORPHINE SULFATE (PF) 4 MG/ML IV SOLN
4.0000 mg | Freq: Once | INTRAVENOUS | Status: AC
  Administered 2018-07-04: 4 mg via INTRAVENOUS
  Filled 2018-07-04: qty 1

## 2018-07-04 MED ORDER — SODIUM CHLORIDE 0.9 % IV BOLUS
500.0000 mL | Freq: Once | INTRAVENOUS | Status: AC
  Administered 2018-07-04: 500 mL via INTRAVENOUS

## 2018-07-04 MED ORDER — KETOROLAC TROMETHAMINE 30 MG/ML IJ SOLN
15.0000 mg | Freq: Once | INTRAMUSCULAR | Status: AC
  Administered 2018-07-04: 15 mg via INTRAVENOUS
  Filled 2018-07-04: qty 1

## 2018-07-04 MED ORDER — ENOXAPARIN SODIUM 100 MG/ML ~~LOC~~ SOLN
1.0000 mg/kg | Freq: Once | SUBCUTANEOUS | Status: AC
  Administered 2018-07-04: 90 mg via SUBCUTANEOUS
  Filled 2018-07-04: qty 1

## 2018-07-04 NOTE — ED Provider Notes (Signed)
[facility] EMERGENCY DEPARTMENT Provider Note   CSN: [id number] Arrival date & time: 07/04/18  1742    History   Chief Complaint Chief Complaint  Patient presents with  . Leg Pain    HPI Linda Solis is a 43 y.o. female.     HPI Patient presents with concern of left leg swelling, dyspnea. Patient has a history of anxiety, migraines, DVT 7 years ago, following hysterectomy. She does not currently take any blood thinning medication. She notes that today, about 3 hours ago she noticed pain starting in the inferior left foot, radiating superiorly. Pain is now focally throughout the proximal left leg, inguinal area. No abdominal pain. There was some dyspnea, seems to have resolved now that she is resting. No syncope, no true chest pain, no fever, chills, cough. No clear precipitant, and since onset no clear alleviating or exacerbating factors. She is here with 2 family members who corroborates the HPI. Past Medical History:  Diagnosis Date  . Anxiety   . Depression   . Headache(784.0)    migraines  . Sleep apnea    has CPAP - does not use    Patient Active Problem List   Diagnosis Date Noted  . 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 CHOLECYSTECTOMY    . LAPAROSCOPIC GASTRIC BANDING  2009  . LAPAROSCOPIC HYSTERECTOMY  02/07/2012   Procedure: HYSTERECTOMY TOTAL LAPAROSCOPIC;  Surgeon: Claiborne Billings A. Pamala Hurry, MD;  Location: [facility];  Service: Gynecology;  Laterality: N/A;  with Bilateral Salpingectomies  . NOVASURE ABLATION    . TUBAL LIGATION       OB History   No obstetric history on file.      Home Medications    Prior to Admission medications   Medication Sig Start Date End Date Taking? Authorizing Provider  butorphanol (STADOL) 10 MG/ML nasal spray Place 1 spray into the nose every 4 (four) hours as needed. For migraines    [provider]  clonazePAM (KLONOPIN) 1 MG tablet Take 1.5  mg by mouth at bedtime.     [provider]  cloNIDine (CATAPRES - DOSED IN MG/24 HR) 0.1 mg/24hr patch  05/21/12   [provider]  oxyCODONE-acetaminophen (PERCOCET/ROXICET) 5-325 MG per tablet Take 1 tablet by mouth every 6 (six) hours as needed (moderate to severe pain (when tolerating fluids)). Patient not taking: Reported on 03/21/2018 05/28/12   Curt Bears, MD  promethazine (PHENERGAN) 25 MG tablet Take 25 mg by mouth every 6 (six) hours as needed. For nausea associated with migraines    [provider]  propranolol (INDERAL) 80 MG tablet Take 80-160 mg by mouth 2 (two) times daily. Takes 160 mg (2 tabs) in am and 80 mg (1 tab) in pm    [provider]  traZODone (DESYREL) 100 MG tablet Take 150 mg by mouth at bedtime as needed. For insomnia    [provider]    Family History Family History  Problem Relation Age of Onset  . Stroke Mother   . Diabetes Father   . High blood pressure Father     Social History Social History   Tobacco Use  . Smoking status: Never Smoker  . Smokeless tobacco: Never Used  Substance Use Topics  . Alcohol use: No  . Drug use: No     Allergies   Ceftin [cefuroxime axetil]; Cephalosporins; Epinephrine-lidocaine-na metabisulfite [lidocaine-epinephrine]; Erythromycin; Penicillins; and Sumatriptan   Review of Systems Review of  Systems  Constitutional:       Per HPI, otherwise negative  HENT:       Per HPI, otherwise negative  Respiratory:       Per HPI, otherwise negative  Cardiovascular:       Per HPI, otherwise negative  Gastrointestinal: Negative for vomiting.  Endocrine:       Negative aside from HPI  Genitourinary:       Neg aside from HPI   Musculoskeletal:       Per HPI, otherwise negative  Skin: Negative.   Neurological: Positive for headaches. Negative for syncope.  Hematological:       Per HPI     Physical Exam Updated Vital Signs BP 118/85 (BP Location: Right Arm)    Pulse 79   Temp (!) 97.3 F (36.3 C) (Temporal)   Resp 16   Ht 5\' 6"  (1.676 m)   Wt 88.9 kg   SpO2 100%   BMI 31.64 kg/m   Physical Exam Vitals signs and nursing note reviewed.  Constitutional:      General: She is not in acute distress.    Appearance: She is well-developed.  HENT:     Head: Normocephalic and atraumatic.  Eyes:     Conjunctiva/sclera: Conjunctivae normal.  Cardiovascular:     Rate and Rhythm: Normal rate and regular rhythm.  Pulmonary:     Effort: Pulmonary effort is normal. No respiratory distress.     Breath sounds: Normal breath sounds. No stridor.  Abdominal:     General: There is no distension.  Musculoskeletal:     Comments: Left leg bigger throughout, approximately 1 cm larger circumference in the calf, slightly larger in the distal thigh, no gross superficial changes, no erythema, warmth.  Range of motion, strength unremarkable.  Skin:    General: Skin is warm and dry.  Neurological:     Mental Status: She is alert and oriented to person, place, and time.     Cranial Nerves: No cranial nerve deficit.      ED Treatments / Results  Labs (all labs ordered are listed, but only abnormal results are displayed) Labs Reviewed  BASIC METABOLIC PANEL - Abnormal; Notable for the following components:      Result Value   Creatinine, Ser 1.21 (*)    GFR calc non Af Amer 55 (*)    All other components within normal limits  CBC WITH DIFFERENTIAL/PLATELET - Abnormal; Notable for the following components:   RBC 3.84 (*)    Hemoglobin 11.6 (*)    HCT 35.7 (*)    Eosinophils Absolute 0.6 (*)    All other components within normal limits  D-DIMER, QUANTITATIVE (NOT AT [facility])    EKG EKG Interpretation  Date/Time:  Thursday July 04 2018 18:51:38 EST Ventricular Rate:  79 PR Interval:    QRS Duration: 101 QT Interval:  365 QTC Calculation: 419 R Axis:   57 Text Interpretation:  Sinus rhythm Baseline wander in lead(s) III T wave abnormality Abnormal  ekg Confirmed by Carmin Muskrat [phone]) on 07/04/2018 7:33:45 PM   Radiology Dg Chest 2 View  Result Date: 07/04/2018 CLINICAL DATA:  Short of breath EXAM: CHEST - 2 VIEW COMPARISON:  None. FINDINGS: Normal heart size. Lungs clear. No pneumothorax. No pleural effusion. IMPRESSION: No active cardiopulmonary disease. Electronically Signed   By: Marybelle Killings M.D.   On: 07/04/2018 19:44    Procedures Procedures (including critical care time)  Medications Ordered in ED Medications  sodium chloride 0.9 %  bolus 500 mL (has no administration in time range)  HYDROmorphone (DILAUDID) injection 1 mg (has no administration in time range)  ketorolac (TORADOL) 30 MG/ML injection 15 mg (15 mg Intravenous Given 07/04/18 1944)  morphine 4 MG/ML injection 4 mg (4 mg Intravenous Given 07/04/18 1944)  sodium chloride 0.9 % bolus 500 mL (500 mLs Intravenous New Bag/Given 07/04/18 1944)     Initial Impression / Assessment and Plan / ED Course  I have reviewed the triage vital signs and the nursing notes.  Pertinent labs & imaging results that were available during my care of the patient were reviewed by me and considered in my medical decision making (see chart for details).        8:29 PM On repeat exam the patient is awake and alert. She has had minimal pain control with Toradol, morphine. She will receive a dose of Dilaudid.  Initial labs discussed at length, including evidence for mild dehydration, d-dimer result equivocal.  Given the patient's history of thrombosis, she will return in the morning for ultrasound of her leg, will receive empiric Lovenox.  She now describes more in depth her history of the leg issues, including 2 surgeries of the knee, ongoing arthritic-like changes.  This is likely contributing her pain, with or without DVT. No evidence for arterial occlusion, systemic disease, PE on physical exam, no hypoxia, tachypnea, tachycardia. Patient will be discharged with next day follow-up  for ultrasound studies.  Final Clinical Impressions(s) / ED Diagnoses  Left leg swelling   Carmin Muskrat, MD 07/04/18 2056

## 2018-07-04 NOTE — Discharge Instructions (Addendum)
As discussed, today's evaluation has been generally reassuring, but it is important that you return tomorrow for ultrasound of your leg to exclude the possibility of a blood clot.  Return here for concerning changes in your condition, should they occur prior to your repeat evaluation.

## 2018-07-04 NOTE — ED Triage Notes (Addendum)
Pain to whole lt leg and swelling that started around 4 hours ago.  Pt states she has hx of dvt

## 2018-07-05 ENCOUNTER — Ambulatory Visit (HOSPITAL_COMMUNITY)
Admission: RE | Admit: 2018-07-05 | Discharge: 2018-07-05 | Disposition: A | Discharge status: Home / Self Care | Payer: BLUE CROSS/BLUE SHIELD | Source: Ambulatory Visit | Attending: Emergency Medicine | Admitting: Emergency Medicine

## 2018-07-05 DIAGNOSIS — M79605 Pain in left leg: Secondary | ICD-10-CM | POA: Insufficient documentation

## 2019-03-19 ENCOUNTER — Other Ambulatory Visit: Payer: Self-pay | Admitting: Physician Assistant

## 2019-09-01 ENCOUNTER — Encounter: Payer: Self-pay | Admitting: *Deleted

## 2019-09-02 ENCOUNTER — Encounter: Payer: Self-pay | Admitting: Physician Assistant

## 2019-09-02 ENCOUNTER — Ambulatory Visit (INDEPENDENT_AMBULATORY_CARE_PROVIDER_SITE_OTHER): Payer: BC Managed Care – PPO | Admitting: Physician Assistant

## 2019-09-02 ENCOUNTER — Other Ambulatory Visit: Payer: Self-pay

## 2019-09-02 DIAGNOSIS — C44722 Squamous cell carcinoma of skin of right lower limb, including hip: Secondary | ICD-10-CM | POA: Diagnosis not present

## 2019-09-02 NOTE — Patient Instructions (Signed)

## 2019-09-02 NOTE — Progress Notes (Addendum)
   Follow-Up Visit   Subjective  Linda Solis is a 44 y.o. female who presents for the following: Skin Problem (right thigh-2 weeks-growing). Tender, crusty, persistent.   The following portions of the chart were reviewed this encounter and updated as appropriate: PMH, smoking, medications.    Objective  Well appearing patient in no apparent distress; mood and affect are within normal limits.  A focused examination was performed including right thigh. Relevant physical exam findings are noted in the Assessment and Plan.  Objective  Right Thigh - Anterior: Volcano growth on pink base  Txpbx-curet & cauthery 1.1cm     Assessment & Plan  SCC (squamous cell carcinoma), leg, right Right Thigh - Anterior  Skin / nail biopsy Type of biopsy: tangential   Informed consent: discussed and consent obtained   Timeout: patient name, date of birth, surgical site, and procedure verified   Procedure prep:  Patient was prepped and draped in usual sterile fashion Prep type:  Chlorhexidine Anesthesia: the lesion was anesthetized in a standard fashion   Anesthetic:  1% lidocaine w/ epinephrine 1-100,000 local infiltration Instrument used: flexible razor blade   Hemostasis achieved with: aluminum chloride   Outcome: patient tolerated procedure well   Post-procedure details: wound care instructions given   Additional details:  PERFORMED ED&C AFTER BIOPSY   Destruction of lesion Complexity: simple   Destruction method: electrodesiccation and curettage   Informed consent: discussed and consent obtained   Timeout:  patient name, date of birth, surgical site, and procedure verified Anesthesia: the lesion was anesthetized in a standard fashion   Anesthetic:  1% lidocaine w/ epinephrine 1-100,000 local infiltration Curettage performed in three different directions: Yes   Electrodesiccation performed over the curetted area: Yes   Curettage cycles:  3 Lesion length (cm):   1.1 Margin per side (cm):  0.1 Final wound size (cm):  1.1 Hemostasis achieved with:  aluminum chloride Outcome: patient tolerated procedure well with no complications   Post-procedure details: wound care instructions given    Specimen 1 - Surgical pathology Differential Diagnosis: KA Check Margins: No I, Talina Pleitez, PA-C, have reviewed all documentation for this visit. The documentation on 12/26/19 for the exam, diagnosis, procedures, and orders are all accurate and complete.

## 2019-09-04 ENCOUNTER — Telehealth: Payer: Self-pay

## 2019-09-04 NOTE — Telephone Encounter (Signed)
Phone call to patient with her pathology results and [facility] recommendations.  Patient aware of pathology results and Kelli's recommendations.  Yearly appointment scheduled.

## 2019-09-04 NOTE — Telephone Encounter (Signed)
-----   Message from Warren Danes, [place] sent at 09/04/2019 10:04 AM EDT ----- Rtc prn. Yearly skin exam.

## 2019-10-18 NOTE — Addendum Note (Signed)
Addended by: Robyne Askew R on: 10/18/2019 03:30 PM   Modules accepted: Level of Service

## 2019-11-26 ENCOUNTER — Telehealth: Payer: Self-pay | Admitting: Physician Assistant

## 2019-11-26 NOTE — Telephone Encounter (Signed)
Patient called to schedule appointment for lesion on scalp. Offered next available appointment on Oct 1st. She asked that message be sent to [facility] to ask for work in appointment. Per patient Linda Solis told her to do this if she didn't have soon enough appointment.

## 2019-11-27 NOTE — Telephone Encounter (Signed)
Put her in on the end of the day somewhere.  Thank you

## 2019-11-28 NOTE — Telephone Encounter (Signed)
Earlier appointment scheduled, patient aware.

## 2019-12-04 ENCOUNTER — Ambulatory Visit: Payer: BC Managed Care – PPO | Admitting: Physician Assistant

## 2019-12-26 NOTE — Addendum Note (Signed)
Addended by: Warren Danes on: 12/26/2019 04:32 PM   Modules accepted: Level of Service

## 2019-12-31 ENCOUNTER — Other Ambulatory Visit: Payer: Self-pay

## 2019-12-31 ENCOUNTER — Ambulatory Visit: Payer: BC Managed Care – PPO | Admitting: Physician Assistant

## 2019-12-31 ENCOUNTER — Encounter: Payer: Self-pay | Admitting: Physician Assistant

## 2019-12-31 DIAGNOSIS — Z85828 Personal history of other malignant neoplasm of skin: Secondary | ICD-10-CM | POA: Diagnosis not present

## 2019-12-31 DIAGNOSIS — Z87898 Personal history of other specified conditions: Secondary | ICD-10-CM

## 2019-12-31 DIAGNOSIS — Z86018 Personal history of other benign neoplasm: Secondary | ICD-10-CM | POA: Diagnosis not present

## 2019-12-31 DIAGNOSIS — D485 Neoplasm of uncertain behavior of skin: Secondary | ICD-10-CM

## 2019-12-31 NOTE — Patient Instructions (Signed)

## 2019-12-31 NOTE — Progress Notes (Signed)
   Follow up Visit  Subjective  Linda Solis is a 44 y.o. female who presents for the following: Skin Problem (scalp x 2 months- no itch no bleed- "new"). History of BCC and SCC within the past year. Now has a spot the scalp that has been there a month. It is raised but it isnt tender and doesn't itch. We will also recheck the [facility] site on the thigh.   Objective  Well appearing patient in no apparent distress; mood and affect are within normal limits.  A focused examination was performed including scalp and right leg and right ear. Relevant physical exam findings are noted in the Assessment and Plan.   Objective  Right Frontal Scalp: Crusted papule     Objective  Right Anterior Helix: 2020  Objective  Right Thigh - Anterior: 5 since 2012  Objective  Right Thigh - Anterior: Dyspigmented scar. Appears clear.   Assessment & Plan  Neoplasm of uncertain behavior of skin Right Frontal Scalp  Skin / nail biopsy Type of biopsy: tangential   Informed consent: discussed and consent obtained   Timeout: patient name, date of birth, surgical site, and procedure verified   Procedure prep:  Patient was prepped and draped in usual sterile fashion (Non sterile) Prep type:  Chlorhexidine Anesthesia: the lesion was anesthetized in a standard fashion   Anesthetic:  1% lidocaine w/ epinephrine 1-100,000 local infiltration Instrument used: flexible razor blade   Outcome: patient tolerated procedure well   Post-procedure details: wound care instructions given    Specimen 1 - Surgical pathology Differential Diagnosis: scc vs bcc Check Margins: No  History of basal cell carcinoma (BCC) Right Anterior Helix  History of atypical nevus Right Thigh - Anterior  History of SCC (squamous cell carcinoma) of skin Right Thigh - Anterior  Recheck 6 months

## 2020-01-22 ENCOUNTER — Ambulatory Visit: Payer: BC Managed Care – PPO | Admitting: Physician Assistant

## 2020-02-26 IMAGING — US VENOUS DOPPLER ULTRASOUND OF LEFT LOWER EXTREMITY
1 series · 14 of 24 positions shown · non-contrast
Comparison: None.

CLINICAL DATA: Left lower extremity pain for 1 day

EXAM:
LEFT LOWER EXTREMITY VENOUS DUPLEX ULTRASOUND
TECHNIQUE: Doppler venous assessment of the left lower extremity deep venous
system was performed, including characterization of spectral flow,
compressibility, and phasicity.

[Series 1: venous doppler ultrasound of left lower extremity · 0.08mm/px · 14 of 36 slices shown]
[im 1/36]
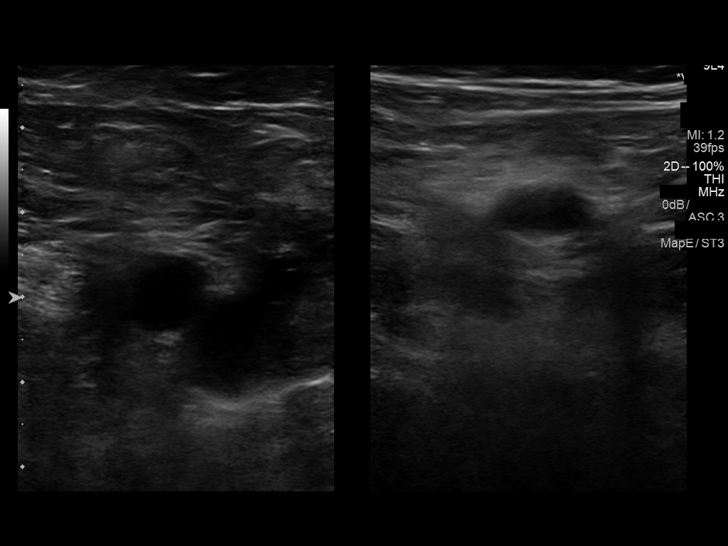
[im 4/36]
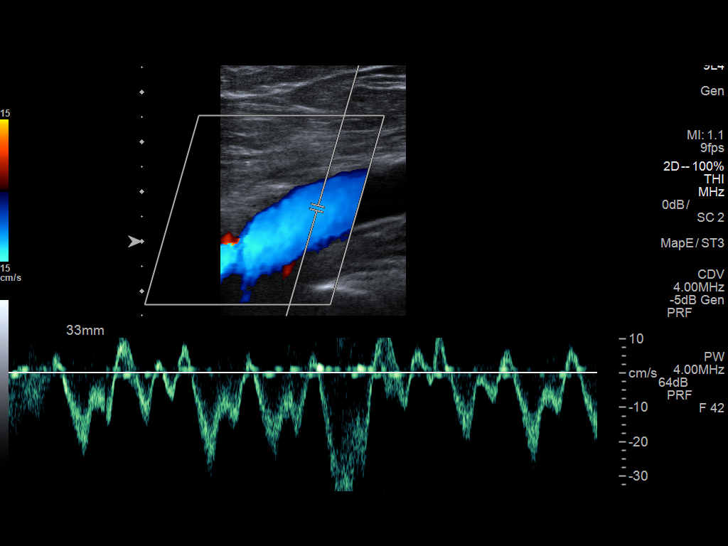
[im 7/36]
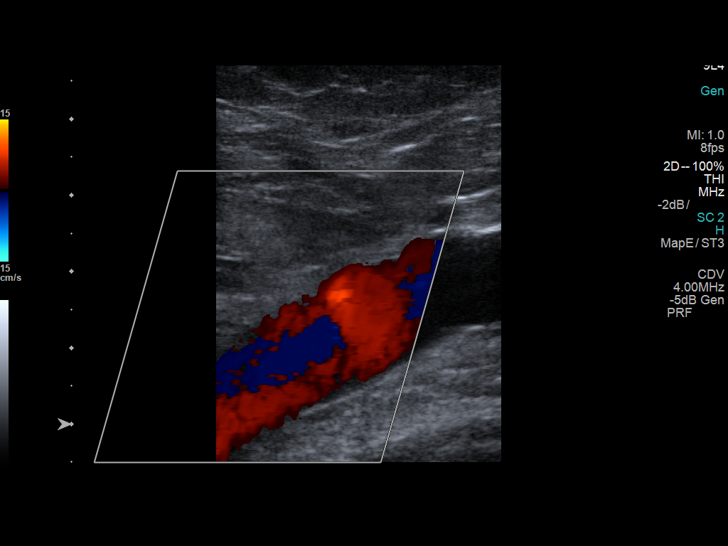
[im 10/36]
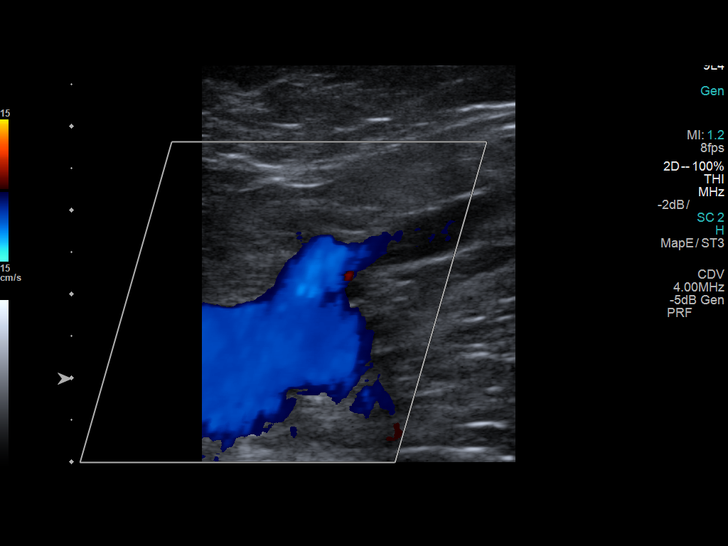
[im 11/36]
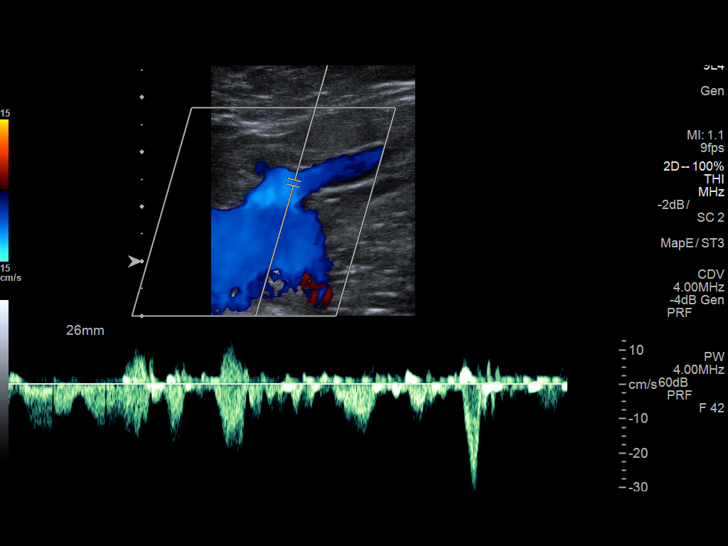
[im 14/36]
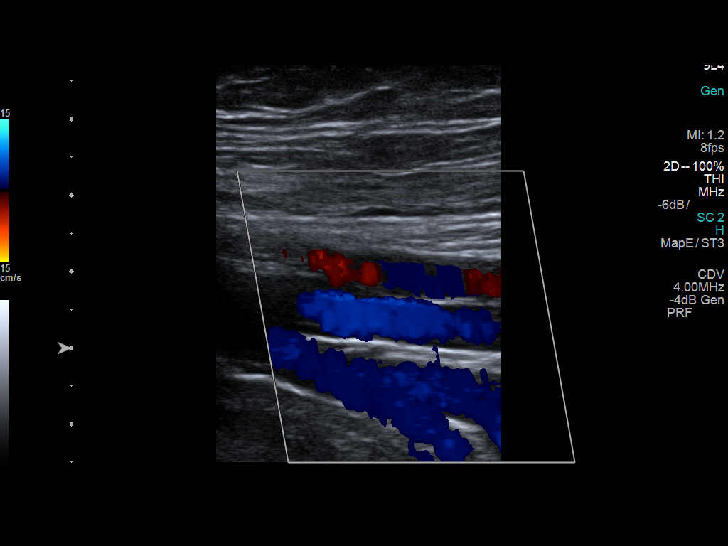
[im 17/36]
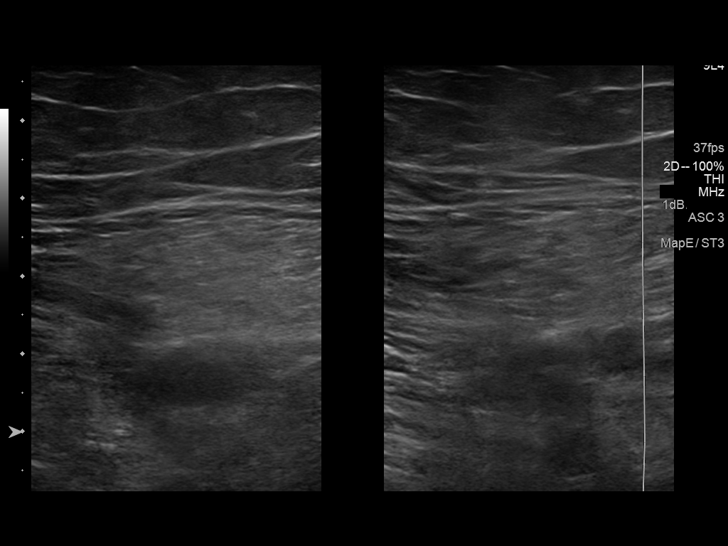
[im 19/36]
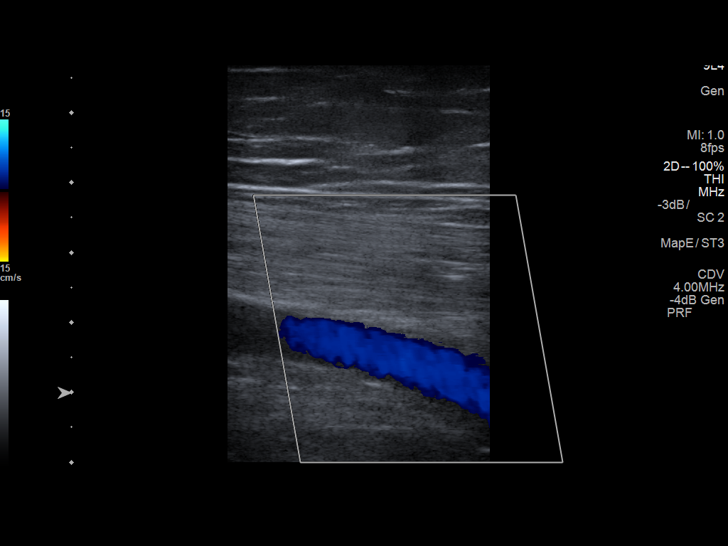
[im 22/36]
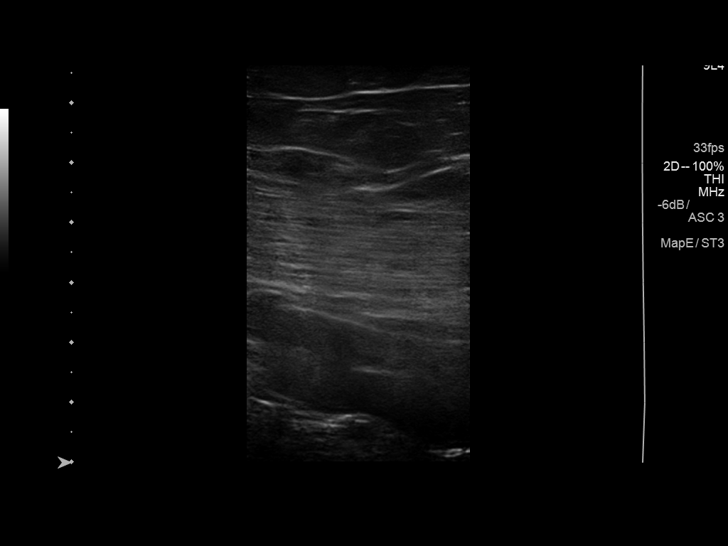
[im 25/36]
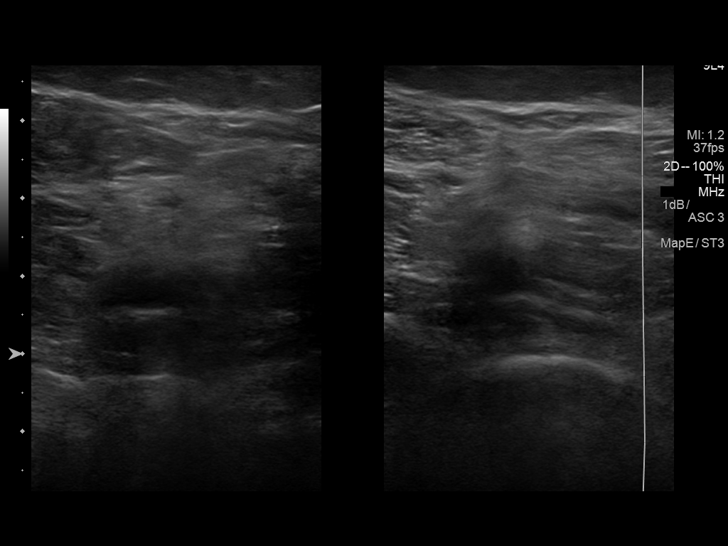
[im 28/36]
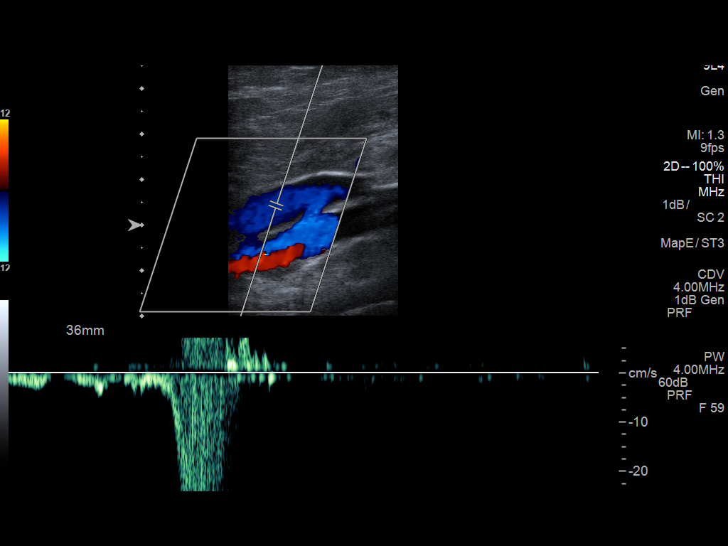
[im 29/36]
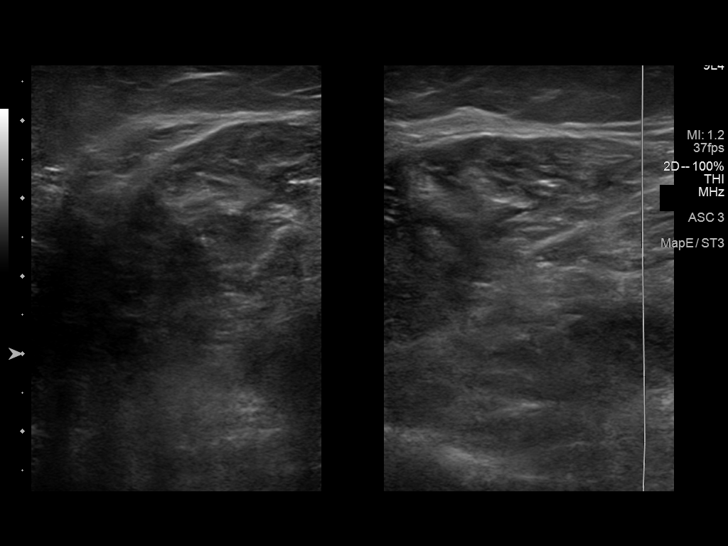
[im 32/36]
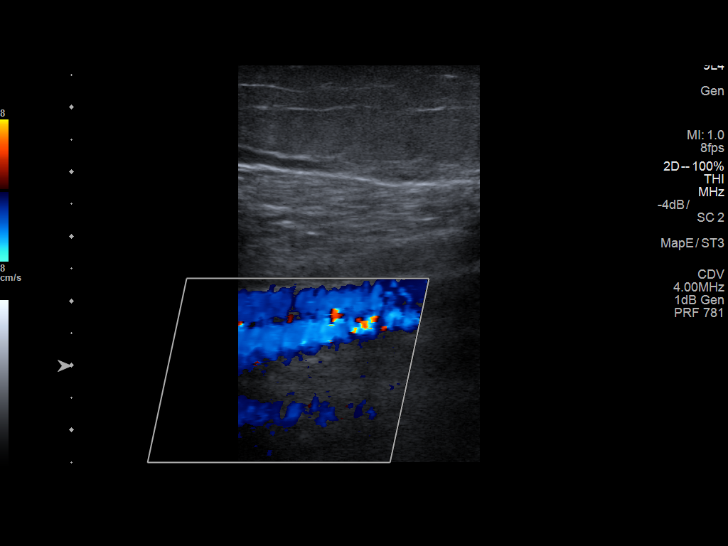
[im 36/36]
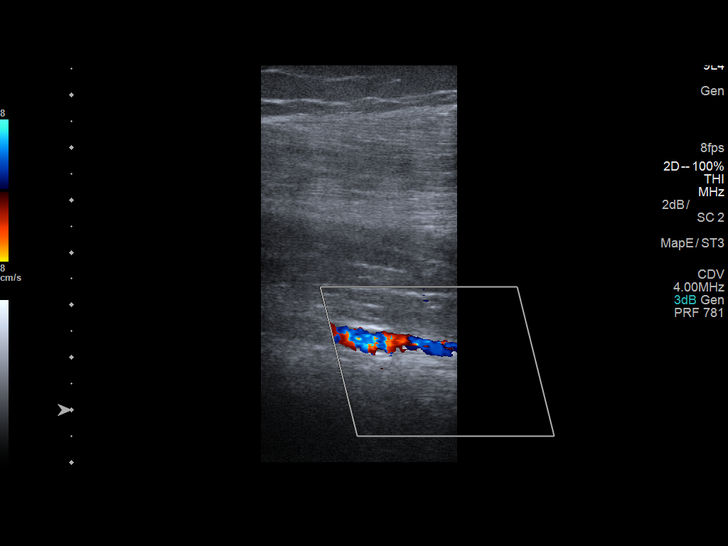

[14 of 24 positions shown; findings below may reference images not displayed]

FINDINGS: There is complete compressibility of the left common femoral,
femoral, and popliteal veins. Doppler analysis demonstrates
respiratory phasicity and augmentation of flow with calf
compression. No obvious superficial vein or calf vein thrombosis.
IMPRESSION: No evidence of left lower extremity DVT.

## 2020-08-25 ENCOUNTER — Ambulatory Visit: Payer: BC Managed Care – PPO | Admitting: Physician Assistant

## 2020-08-27 ENCOUNTER — Ambulatory Visit: Payer: BC Managed Care – PPO | Admitting: Physician Assistant

## 2020-11-18 ENCOUNTER — Ambulatory Visit: Payer: BC Managed Care – PPO | Admitting: Physician Assistant

## 2021-02-10 ENCOUNTER — Ambulatory Visit: Payer: BC Managed Care – PPO | Admitting: Physician Assistant

## 2021-06-08 ENCOUNTER — Ambulatory Visit: Payer: BC Managed Care – PPO | Admitting: Physician Assistant

## 2021-06-28 ENCOUNTER — Ambulatory Visit: Payer: BC Managed Care – PPO | Admitting: Physician Assistant

## 2021-07-04 ENCOUNTER — Ambulatory Visit: Payer: BC Managed Care – PPO | Admitting: Physician Assistant

## 2021-07-11 ENCOUNTER — Ambulatory Visit: Payer: BC Managed Care – PPO | Admitting: Physician Assistant

## 2021-10-11 ENCOUNTER — Ambulatory Visit: Payer: BC Managed Care – PPO | Admitting: Physician Assistant

## 2023-09-25 ENCOUNTER — Encounter (HOSPITAL_COMMUNITY): Payer: Self-pay | Admitting: *Deleted

## 2023-09-25 ENCOUNTER — Emergency Department (HOSPITAL_COMMUNITY)
Admission: EM | Admit: 2023-09-25 | Discharge: 2023-09-26 | Disposition: A | Discharge status: Home / Self Care | Attending: Emergency Medicine | Admitting: Emergency Medicine

## 2023-09-25 ENCOUNTER — Emergency Department (HOSPITAL_COMMUNITY)

## 2023-09-25 ENCOUNTER — Other Ambulatory Visit: Payer: Self-pay

## 2023-09-25 DIAGNOSIS — R1032 Left lower quadrant pain: Secondary | ICD-10-CM | POA: Insufficient documentation

## 2023-09-25 DIAGNOSIS — R110 Nausea: Secondary | ICD-10-CM | POA: Insufficient documentation

## 2023-09-25 LAB — CBC WITH DIFFERENTIAL/PLATELET
Abs Immature Granulocytes: 0.01 10*3/uL (ref 0.00–0.07)
Basophils Absolute: 0.1 10*3/uL (ref 0.0–0.1)
Basophils Relative: 1 %
Eosinophils Absolute: 0.3 10*3/uL (ref 0.0–0.5)
Eosinophils Relative: 6 %
HCT: 40.5 % (ref 36.0–46.0)
Hemoglobin: 13.5 g/dL (ref 12.0–15.0)
Immature Granulocytes: 0 %
Lymphocytes Relative: 26 %
Lymphs Abs: 1.6 10*3/uL (ref 0.7–4.0)
MCH: 29.7 pg (ref 26.0–34.0)
MCHC: 33.3 g/dL (ref 30.0–36.0)
MCV: 89.2 fL (ref 80.0–100.0)
Monocytes Absolute: 0.5 10*3/uL (ref 0.1–1.0)
Monocytes Relative: 8 %
Neutro Abs: 3.6 10*3/uL (ref 1.7–7.7)
Neutrophils Relative %: 59 %
Platelets: 356 10*3/uL (ref 150–400)
RBC: 4.54 MIL/uL (ref 3.87–5.11)
RDW: 13.7 % (ref 11.5–15.5)
WBC: 6 10*3/uL (ref 4.0–10.5)
nRBC: 0 % (ref 0.0–0.2)

## 2023-09-25 LAB — URINALYSIS, ROUTINE W REFLEX MICROSCOPIC
Bilirubin Urine: NEGATIVE
Glucose, UA: NEGATIVE mg/dL
Hgb urine dipstick: NEGATIVE
Ketones, ur: NEGATIVE mg/dL
Leukocytes,Ua: NEGATIVE
Nitrite: NEGATIVE
Protein, ur: NEGATIVE mg/dL
Specific Gravity, Urine: 1.019 (ref 1.005–1.030)
pH: 5 (ref 5.0–8.0)

## 2023-09-25 LAB — PREGNANCY, URINE: Preg Test, Ur: NEGATIVE

## 2023-09-25 MED ORDER — ONDANSETRON HCL 4 MG/2ML IJ SOLN
4.0000 mg | Freq: Once | INTRAMUSCULAR | Status: AC
  Administered 2023-09-25: 4 mg via INTRAVENOUS
  Filled 2023-09-25: qty 2

## 2023-09-25 MED ORDER — MORPHINE SULFATE (PF) 4 MG/ML IV SOLN
4.0000 mg | Freq: Once | INTRAVENOUS | Status: AC
  Administered 2023-09-25: 4 mg via INTRAVENOUS
  Filled 2023-09-25: qty 1

## 2023-09-25 MED ORDER — IOHEXOL 300 MG/ML  SOLN
100.0000 mL | Freq: Once | INTRAMUSCULAR | Status: AC | PRN
  Administered 2023-09-25: 100 mL via INTRAVENOUS

## 2023-09-25 NOTE — ED Triage Notes (Signed)
 Pt with mid to left lower abd pain, pt believes she has ovarian cyst to rupture. Small vaginal bleeding. Pt states she was told she has three cysts on her ovaries.

## 2023-09-25 NOTE — ED Provider Notes (Signed)
 [facility]-EMERGENCY DEPT [facility] Emergency Department Provider Note MRN:  [id number]  Arrival date & time: 09/26/23     Chief Complaint   Abdominal Pain   History of Present Illness   Linda Solis is a 48 y.o. year-old female with a history of ovarian cyst presenting to the ED with chief complaint of abdominal pain.  Persistent and severe left lower quadrant pain this evening.  Feels similar to prior ovarian cyst rupture.  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  Having nausea with the pain but no vomiting, no diarrhea or constipation.  Review of Systems  A thorough review of systems was obtained and all systems are negative except as noted in the HPI and PMH.   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 Johnston Nao, MD;  Location: [facility];  Service: Gynecology;  Laterality: N/A;  with Bilateral Salpingectomies   NOVASURE ABLATION     TUBAL LIGATION      Family History  Problem Relation Age of Onset   Stroke Mother    Diabetes Father    High blood pressure Father     Social History   Socioeconomic History   Marital status: Married    Spouse name: Not on file   Number of children: Not on file   Years of education: Not on file   Highest  education level: Not on file  Occupational History   Not on file  Tobacco Use   Smoking status: Never   Smokeless tobacco: Never  Substance and Sexual Activity   Alcohol use: No   Drug use: No   Sexual activity: Yes    Birth control/protection: Surgical  Other Topics Concern   Not on file  Social History Narrative   Not on file   Social Drivers of Health   Financial Resource Strain: Not on file  Food Insecurity: Not on file  Transportation Needs: Not on file  Physical Activity: Not on file  Stress: Not on file  Social Connections: Not on file  Intimate Partner Violence: Not on file     Physical Exam   Vitals:   09/26/23 0015 09/26/23 0030  BP: 133/79 126/87  Pulse: 69 69  Resp:  16  Temp:  98.2 F (36.8 C)  SpO2: 100% 100%    CONSTITUTIONAL: Well-appearing, NAD NEURO/PSYCH:  Alert and oriented x 3, no focal deficits EYES:  eyes equal and reactive ENT/NECK:  no LAD, no JVD CARDIO: Regular rate, well-perfused, normal S1 and S2 PULM:  CTAB no wheezing or rhonchi GI/GU:  non-distended, left lower quadrant tenderness to palpation MSK/SPINE:  No gross deformities, no edema SKIN:  no rash, atraumatic   *Additional  and/or pertinent findings included in MDM below  Diagnostic and Interventional Summary    EKG Interpretation Date/Time:    Ventricular Rate:    PR Interval:    QRS Duration:    QT Interval:    QTC Calculation:   R Axis:      Text Interpretation:         Labs Reviewed  BASIC METABOLIC PANEL WITH GFR - Abnormal; Notable for the following components:      Result Value   Creatinine, Ser 1.17 (*)    GFR, Estimated 57 (*)    All other components within normal limits  CBC WITH DIFFERENTIAL/PLATELET  URINALYSIS, ROUTINE W REFLEX MICROSCOPIC  PREGNANCY, URINE    CT ABDOMEN PELVIS W CONTRAST  Final Result      Medications  morphine  (PF) 4 MG/ML injection 4 mg (4 mg Intravenous Given 09/25/23 2336)  ondansetron  (ZOFRAN ) injection 4 mg (4 mg  Intravenous Given 09/25/23 2335)  iohexol (OMNIPAQUE) 300 MG/ML solution 100 mL (100 mLs Intravenous Contrast Given 09/25/23 2348)     Procedures  /  Critical Care Procedures  ED Course and Medical Decision Making  Initial Impression and Ddx Suspect ovarian cyst pain, seems overall comfortable appearing in no acute distress, normal vitals, doubt torsion.  Diverticulitis is considered as well.  Past medical/surgical history that increases complexity of ED encounter: History of ovarian cyst rupture  Interpretation of Diagnostics I personally reviewed the Laboratory Testing and my interpretation is as follows: No significant blood count or electrolyte disturbance.  CT unremarkable  Patient Reassessment and Ultimate Disposition/Management     Feeling better, doubt emergent process, appropriate for discharge.  Patient management required discussion with the following services or consulting groups:  None  Complexity of Problems Addressed Acute illness or injury that poses threat of life of bodily function  Additional Data Reviewed and Analyzed Further history obtained from: Further history from spouse/family member  Additional Factors Impacting ED Encounter Risk Use of parenteral controlled substances  Alexyss Balzarini M. Harless Lien, MD [facility] Emergency Medicine [facility] [email or web address]  Final Clinical Impressions(s) / ED Diagnoses     ICD-10-CM   1. Left lower quadrant abdominal pain  R10.32       ED Discharge Orders          Ordered    oxyCODONE  (ROXICODONE ) 5 MG immediate release tablet  Every 4 hours PRN        09/26/23 0055    ondansetron  (ZOFRAN -ODT) 4 MG disintegrating tablet  Every 8 hours PRN        09/26/23 0055             Discharge Instructions Discussed with and Provided to Patient:     Discharge Instructions      You were evaluated in the Emergency Department and after careful evaluation, we did not find any emergent condition  requiring admission or further testing in the hospital.  Your exam/testing today is overall reassuring.  Recommend Tylenol  1000 mg every 4-6 hours and/or Motrin  600 mg every 4-6 hours for pain.  You can use the oxycodone  medication for more significant pain.  Can use the Zofran  as needed for nausea.  Follow-up closely with your PCP or OB/GYN.  Please return to the Emergency Department if you experience any worsening of your condition.   Thank you for allowing us  to be a part of your care.     Edson Graces, MD 09/26/23 [phone]

## 2023-09-25 NOTE — ED Notes (Signed)
 Patient transported to CT

## 2023-09-26 LAB — BASIC METABOLIC PANEL WITH GFR
Anion gap: 8 (ref 5–15)
BUN: 17 mg/dL (ref 6–20)
CO2: 26 mmol/L (ref 22–32)
Calcium: 9 mg/dL (ref 8.9–10.3)
Chloride: 98 mmol/L (ref 98–111)
Creatinine, Ser: 1.17 mg/dL — ABNORMAL HIGH (ref 0.44–1.00)
GFR, Estimated: 57 mL/min — ABNORMAL LOW (ref >60)
Glucose, Bld: 94 mg/dL (ref 70–99)
Potassium: 4.5 mmol/L (ref 3.5–5.1)
Sodium: 137 mmol/L (ref 135–145)

## 2023-09-26 MED ORDER — OXYCODONE HCL 5 MG PO TABS: 5.0000 mg | ORAL_TABLET | ORAL | 0 refills | Status: AC | PRN

## 2023-09-26 MED ORDER — ONDANSETRON 4 MG PO TBDP: 4.0000 mg | ORAL_TABLET | Freq: Three times a day (TID) | ORAL | 0 refills | Status: AC | PRN

## 2023-09-26 NOTE — Discharge Instructions (Signed)
 You were evaluated in the Emergency Department and after careful evaluation, we did not find any emergent condition requiring admission or further testing in the hospital.  Your exam/testing today is overall reassuring.  Recommend Tylenol  1000 mg every 4-6 hours and/or Motrin  600 mg every 4-6 hours for pain.  You can use the oxycodone  medication for more significant pain.  Can use the Zofran  as needed for nausea.  Follow-up closely with your PCP or OB/GYN.  Please return to the Emergency Department if you experience any worsening of your condition.   Thank you for allowing us  to be a part of your care.

## 2023-09-26 NOTE — ED Notes (Signed)
 ED Provider at bedside.

## 2023-12-27 ENCOUNTER — Encounter (HOSPITAL_COMMUNITY): Payer: Self-pay | Admitting: Emergency Medicine

## 2023-12-27 ENCOUNTER — Emergency Department (HOSPITAL_COMMUNITY)
Admission: EM | Admit: 2023-12-27 | Discharge: 2023-12-28 | Disposition: A | Discharge status: Home / Self Care | Attending: Emergency Medicine | Admitting: Emergency Medicine

## 2023-12-27 ENCOUNTER — Other Ambulatory Visit: Payer: Self-pay

## 2023-12-27 DIAGNOSIS — M545 Low back pain, unspecified: Secondary | ICD-10-CM | POA: Diagnosis present

## 2023-12-27 DIAGNOSIS — M62830 Muscle spasm of back: Secondary | ICD-10-CM | POA: Insufficient documentation

## 2023-12-27 MED ORDER — ONDANSETRON 4 MG PO TBDP
4.0000 mg | ORAL_TABLET | Freq: Once | ORAL | Status: AC
  Administered 2023-12-27: 4 mg via ORAL
  Filled 2023-12-27: qty 1

## 2023-12-27 MED ORDER — HYDROMORPHONE HCL 1 MG/ML IJ SOLN
0.5000 mg | Freq: Once | INTRAMUSCULAR | Status: AC
  Administered 2023-12-27: 0.5 mg via INTRAVENOUS
  Filled 2023-12-27: qty 1

## 2023-12-27 NOTE — ED Notes (Signed)
 Pt. Unable to sit back in bed and in tears due to pain. Providers notified of pt.'s pain scale of 10/10. No new orders at this time.
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
## 2023-12-28 ENCOUNTER — Emergency Department (HOSPITAL_COMMUNITY)

## 2023-12-28 MED ORDER — HYDROMORPHONE HCL 1 MG/ML IJ SOLN
1.0000 mg | Freq: Once | INTRAMUSCULAR | Status: AC
  Administered 2023-12-28: 1 mg via INTRAVENOUS
  Filled 2023-12-28: qty 1

## 2023-12-28 MED ORDER — KETOROLAC TROMETHAMINE 15 MG/ML IJ SOLN
15.0000 mg | Freq: Once | INTRAMUSCULAR | Status: AC
  Administered 2023-12-28: 15 mg via INTRAVENOUS
  Filled 2023-12-28: qty 1

## 2023-12-28 MED ORDER — CYCLOBENZAPRINE HCL 10 MG PO TABS: 10.0000 mg | ORAL_TABLET | Freq: Three times a day (TID) | ORAL | 0 refills | Status: AC | PRN

## 2023-12-28 MED ORDER — CYCLOBENZAPRINE HCL 10 MG PO TABS
10.0000 mg | ORAL_TABLET | Freq: Once | ORAL | Status: AC
  Administered 2023-12-28: 10 mg via ORAL
  Filled 2023-12-28: qty 1

## 2023-12-28 MED ORDER — IBUPROFEN 600 MG PO TABS: 600.0000 mg | ORAL_TABLET | Freq: Four times a day (QID) | ORAL | 0 refills | Status: AC | PRN
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
